# Patient Record
Sex: Female | Born: 1935 | Race: White | Hispanic: No | Marital: Single
Health system: Southern US, Community
[De-identification: ages and names within clinical notes are randomized; demographics above are authoritative.]

---

## 1997-12-05 ENCOUNTER — Ambulatory Visit (HOSPITAL_COMMUNITY): Admission: RE | Admit: 1997-12-05 | Discharge: 1997-12-05 | Payer: Self-pay | Admitting: Obstetrics & Gynecology

## 2003-12-31 ENCOUNTER — Other Ambulatory Visit: Payer: Self-pay

## 2004-01-01 ENCOUNTER — Other Ambulatory Visit: Payer: Self-pay

## 2004-09-21 ENCOUNTER — Other Ambulatory Visit: Payer: Self-pay

## 2004-09-21 ENCOUNTER — Inpatient Hospital Stay: Payer: Self-pay | Admitting: Internal Medicine

## 2004-12-28 ENCOUNTER — Ambulatory Visit: Payer: Self-pay | Admitting: Internal Medicine

## 2005-03-10 ENCOUNTER — Emergency Department: Payer: Self-pay | Admitting: Emergency Medicine

## 2005-03-10 ENCOUNTER — Other Ambulatory Visit: Payer: Self-pay

## 2011-09-18 ENCOUNTER — Inpatient Hospital Stay: Payer: Self-pay | Admitting: Family Medicine

## 2011-09-18 LAB — CBC WITH DIFFERENTIAL/PLATELET
Basophil #: 0 10*3/uL (ref 0.0–0.1)
Eosinophil #: 0.1 10*3/uL (ref 0.0–0.7)
Lymphocyte #: 0.6 10*3/uL — ABNORMAL LOW (ref 1.0–3.6)
Lymphocyte %: 14.6 %
MCV: 94 fL (ref 80–100)
Monocyte %: 12.4 %
Neutrophil #: 2.8 10*3/uL (ref 1.4–6.5)
Neutrophil %: 71 %
Platelet: 108 10*3/uL — ABNORMAL LOW (ref 150–440)
RBC: 3.98 10*6/uL (ref 3.80–5.20)
RDW: 14.1 % (ref 11.5–14.5)
WBC: 3.9 10*3/uL (ref 3.6–11.0)

## 2011-09-18 LAB — URINALYSIS, COMPLETE
Blood: NEGATIVE
Nitrite: NEGATIVE
RBC,UR: 3 /HPF (ref 0–5)
Specific Gravity: 1.006 (ref 1.003–1.030)
Squamous Epithelial: <1
WBC UR: 3 /HPF (ref 0–5)

## 2011-09-18 LAB — COMPREHENSIVE METABOLIC PANEL
Alkaline Phosphatase: 78 U/L (ref 50–136)
Anion Gap: 10 (ref 7–16)
BUN: 19 mg/dL — ABNORMAL HIGH (ref 7–18)
Bilirubin,Total: 0.7 mg/dL (ref 0.2–1.0)
Calcium, Total: 8.9 mg/dL (ref 8.5–10.1)
Chloride: 97 mmol/L — ABNORMAL LOW (ref 98–107)
Co2: 30 mmol/L (ref 21–32)
Creatinine: 0.86 mg/dL (ref 0.60–1.30)
EGFR (African American): >60
EGFR (Non-African Amer.): >60
Osmolality: 278 (ref 275–301)
Potassium: 3.7 mmol/L (ref 3.5–5.1)
Sodium: 137 mmol/L (ref 136–145)
Total Protein: 7.5 g/dL (ref 6.4–8.2)

## 2011-09-18 LAB — DIGOXIN LEVEL: Digoxin: 2.33 ng/mL

## 2011-09-18 LAB — PROTIME-INR
INR: 1.6
Prothrombin Time: 19.8 secs — ABNORMAL HIGH (ref 11.5–14.7)

## 2011-09-19 LAB — CBC WITH DIFFERENTIAL/PLATELET
Basophil #: 0 10*3/uL (ref 0.0–0.1)
Basophil %: 0.6 %
Lymphocyte #: 0.5 10*3/uL — ABNORMAL LOW (ref 1.0–3.6)
MCH: 31.5 pg (ref 26.0–34.0)
MCV: 94 fL (ref 80–100)
Monocyte #: 0.4 10*3/uL (ref 0.0–0.7)
Platelet: 93 10*3/uL — ABNORMAL LOW (ref 150–440)
RDW: 13.8 % (ref 11.5–14.5)

## 2011-09-19 LAB — HEMOGLOBIN A1C: Hemoglobin A1C: 6.9 % — ABNORMAL HIGH (ref 4.2–6.3)

## 2011-09-19 LAB — BASIC METABOLIC PANEL
BUN: 16 mg/dL (ref 7–18)
Chloride: 101 mmol/L (ref 98–107)
EGFR (African American): >60
EGFR (Non-African Amer.): >60
Glucose: 137 mg/dL — ABNORMAL HIGH (ref 65–99)
Osmolality: 285 (ref 275–301)
Potassium: 3.4 mmol/L — ABNORMAL LOW (ref 3.5–5.1)
Sodium: 141 mmol/L (ref 136–145)

## 2011-09-19 LAB — LIPID PANEL
HDL Cholesterol: 20 mg/dL — ABNORMAL LOW (ref 40–60)
Ldl Cholesterol, Calc: 57 mg/dL (ref 0–100)
Triglycerides: 127 mg/dL (ref 0–200)
VLDL Cholesterol, Calc: 25 mg/dL (ref 5–40)

## 2011-09-19 LAB — CK-MB: CK-MB: 1 ng/mL (ref 0.5–3.6)

## 2011-09-19 LAB — TROPONIN I: Troponin-I: 0.11 ng/mL — ABNORMAL HIGH

## 2011-09-20 LAB — BASIC METABOLIC PANEL
Anion Gap: 10 (ref 7–16)
BUN: 23 mg/dL — ABNORMAL HIGH (ref 7–18)
Calcium, Total: 8 mg/dL — ABNORMAL LOW (ref 8.5–10.1)
Co2: 31 mmol/L (ref 21–32)
Creatinine: 1.36 mg/dL — ABNORMAL HIGH (ref 0.60–1.30)
EGFR (African American): 47 — ABNORMAL LOW
Sodium: 140 mmol/L (ref 136–145)

## 2011-09-20 LAB — URINE CULTURE

## 2011-09-21 LAB — BASIC METABOLIC PANEL
BUN: 31 mg/dL — ABNORMAL HIGH (ref 7–18)
Calcium, Total: 8.4 mg/dL — ABNORMAL LOW (ref 8.5–10.1)
Chloride: 101 mmol/L (ref 98–107)
Osmolality: 290 (ref 275–301)
Potassium: 3.5 mmol/L (ref 3.5–5.1)
Sodium: 141 mmol/L (ref 136–145)

## 2011-09-21 LAB — CBC WITH DIFFERENTIAL/PLATELET
Basophil #: 0 10*3/uL (ref 0.0–0.1)
Eosinophil #: 0 10*3/uL (ref 0.0–0.7)
Lymphocyte #: 0.9 10*3/uL — ABNORMAL LOW (ref 1.0–3.6)
MCH: 31 pg (ref 26.0–34.0)
MCV: 96 fL (ref 80–100)
Monocyte #: 0.7 10*3/uL (ref 0.0–0.7)
Monocyte %: 6.4 %
Neutrophil #: 9.5 10*3/uL — ABNORMAL HIGH (ref 1.4–6.5)
Platelet: 98 10*3/uL — ABNORMAL LOW (ref 150–440)
RDW: 14.2 % (ref 11.5–14.5)
WBC: 11.2 10*3/uL — ABNORMAL HIGH (ref 3.6–11.0)

## 2011-09-21 LAB — PROTIME-INR: Prothrombin Time: 23.4 secs — ABNORMAL HIGH (ref 11.5–14.7)

## 2011-09-22 LAB — CBC WITH DIFFERENTIAL/PLATELET
Basophil %: 0.7 %
Eosinophil #: 0 10*3/uL (ref 0.0–0.7)
Eosinophil %: 0.1 %
HGB: 11.4 g/dL — ABNORMAL LOW (ref 12.0–16.0)
Lymphocyte #: 1.1 10*3/uL (ref 1.0–3.6)
MCH: 31.1 pg (ref 26.0–34.0)
MCHC: 33 g/dL (ref 32.0–36.0)
MCV: 95 fL (ref 80–100)
Monocyte #: 0.6 10*3/uL (ref 0.0–0.7)
Neutrophil #: 9 10*3/uL — ABNORMAL HIGH (ref 1.4–6.5)
RBC: 3.65 10*6/uL — ABNORMAL LOW (ref 3.80–5.20)

## 2011-09-22 LAB — BASIC METABOLIC PANEL
BUN: 40 mg/dL — ABNORMAL HIGH (ref 7–18)
Calcium, Total: 8.2 mg/dL — ABNORMAL LOW (ref 8.5–10.1)
Creatinine: 1.12 mg/dL (ref 0.60–1.30)
Glucose: 129 mg/dL — ABNORMAL HIGH (ref 65–99)
Sodium: 137 mmol/L (ref 136–145)

## 2011-09-22 LAB — PROTIME-INR
INR: 2.4
Prothrombin Time: 26.3 secs — ABNORMAL HIGH (ref 11.5–14.7)

## 2011-09-23 LAB — CBC WITH DIFFERENTIAL/PLATELET
Basophil #: 0 x10 3/mm 3
Basophil %: 0.4 %
Eosinophil #: 0.2 x10 3/mm 3
Eosinophil %: 1.9 %
HCT: 35 %
HGB: 11.2 g/dL — ABNORMAL LOW
Lymphocyte %: 11.4 %
Lymphs Abs: 1.1 x10 3/mm 3
MCH: 30.6 pg
MCHC: 32.1 g/dL
MCV: 96 fL
Monocyte #: 0.7 x10 3/mm 3
Monocyte %: 7.3 %
Neutrophil #: 7.3 x10 3/mm 3 — ABNORMAL HIGH
Neutrophil %: 79 %
Platelet: 161 x10 3/mm 3
RBC: 3.66 X10 6/mm 3 — ABNORMAL LOW
RDW: 14.3 %
WBC: 9.3 x10 3/mm 3

## 2011-09-23 LAB — BASIC METABOLIC PANEL
Anion Gap: 9 (ref 7–16)
BUN: 49 mg/dL — ABNORMAL HIGH (ref 7–18)
Chloride: 98 mmol/L (ref 98–107)
Co2: 30 mmol/L (ref 21–32)
Creatinine: 1.04 mg/dL (ref 0.60–1.30)
Potassium: 4.3 mmol/L (ref 3.5–5.1)
Sodium: 137 mmol/L (ref 136–145)

## 2011-09-23 LAB — PROTIME-INR
INR: 2.7
Prothrombin Time: 28.9 s — ABNORMAL HIGH

## 2011-09-24 LAB — BASIC METABOLIC PANEL
Anion Gap: 7 (ref 7–16)
Calcium, Total: 9.2 mg/dL (ref 8.5–10.1)
Co2: 31 mmol/L (ref 21–32)
Creatinine: 0.93 mg/dL (ref 0.60–1.30)
EGFR (African American): >60
EGFR (Non-African Amer.): >60
Glucose: 138 mg/dL — ABNORMAL HIGH (ref 65–99)
Sodium: 140 mmol/L (ref 136–145)

## 2011-09-24 LAB — CULTURE, BLOOD (SINGLE)

## 2011-09-25 LAB — CBC WITH DIFFERENTIAL/PLATELET
Basophil #: 0.1 10*3/uL (ref 0.0–0.1)
Eosinophil #: 0.3 10*3/uL (ref 0.0–0.7)
Eosinophil %: 3.3 %
HCT: 38.5 % (ref 35.0–47.0)
Lymphocyte %: 16.2 %
MCHC: 32.5 g/dL (ref 32.0–36.0)
Monocyte %: 9.8 %
Neutrophil #: 6.4 10*3/uL (ref 1.4–6.5)
Neutrophil %: 69.8 %
RBC: 4.06 10*6/uL (ref 3.80–5.20)
RDW: 14.6 % — ABNORMAL HIGH (ref 11.5–14.5)
WBC: 9.2 10*3/uL (ref 3.6–11.0)

## 2011-09-25 LAB — BASIC METABOLIC PANEL
Calcium, Total: 9.5 mg/dL (ref 8.5–10.1)
Co2: 30 mmol/L (ref 21–32)
Creatinine: 0.86 mg/dL (ref 0.60–1.30)
EGFR (Non-African Amer.): >60
Glucose: 152 mg/dL — ABNORMAL HIGH (ref 65–99)
Potassium: 5 mmol/L (ref 3.5–5.1)
Sodium: 140 mmol/L (ref 136–145)

## 2011-09-26 LAB — PROTIME-INR: Prothrombin Time: 43.5 secs — ABNORMAL HIGH (ref 11.5–14.7)

## 2011-09-27 LAB — BASIC METABOLIC PANEL
Anion Gap: 7 (ref 7–16)
Calcium, Total: 9.2 mg/dL (ref 8.5–10.1)
Chloride: 102 mmol/L (ref 98–107)
Co2: 33 mmol/L — ABNORMAL HIGH (ref 21–32)
Creatinine: 0.69 mg/dL (ref 0.60–1.30)
EGFR (African American): >60
Osmolality: 291 (ref 275–301)
Sodium: 142 mmol/L (ref 136–145)

## 2011-09-27 LAB — CBC WITH DIFFERENTIAL/PLATELET
Basophil #: 0 10*3/uL (ref 0.0–0.1)
Basophil %: 0.5 %
Eosinophil #: 0.3 10*3/uL (ref 0.0–0.7)
Eosinophil %: 3.3 %
Lymphocyte #: 1.4 10*3/uL (ref 1.0–3.6)
Lymphocyte %: 18.2 %
MCV: 95 fL (ref 80–100)
Monocyte %: 9.3 %
Neutrophil #: 5.2 10*3/uL (ref 1.4–6.5)
Neutrophil %: 68.7 %
Platelet: 316 10*3/uL (ref 150–440)
RDW: 14.6 % — ABNORMAL HIGH (ref 11.5–14.5)
WBC: 7.6 10*3/uL (ref 3.6–11.0)

## 2011-10-01 LAB — CBC WITH DIFFERENTIAL/PLATELET
Basophil %: 0.7 %
Eosinophil #: 0.2 10*3/uL (ref 0.0–0.7)
Eosinophil %: 2.6 %
HCT: 33.7 % — ABNORMAL LOW (ref 35.0–47.0)
HGB: 11 g/dL — ABNORMAL LOW (ref 12.0–16.0)
Lymphocyte #: 1 10*3/uL (ref 1.0–3.6)
MCH: 30.9 pg (ref 26.0–34.0)
MCV: 95 fL (ref 80–100)
Monocyte #: 0.6 10*3/uL (ref 0.0–0.7)
Monocyte %: 9.2 %
Neutrophil %: 71.3 %
RBC: 3.55 10*6/uL — ABNORMAL LOW (ref 3.80–5.20)

## 2011-10-01 LAB — BASIC METABOLIC PANEL
Anion Gap: 7 (ref 7–16)
BUN: 23 mg/dL — ABNORMAL HIGH (ref 7–18)
Calcium, Total: 9.2 mg/dL (ref 8.5–10.1)
Chloride: 100 mmol/L (ref 98–107)
Co2: 35 mmol/L — ABNORMAL HIGH (ref 21–32)
Creatinine: 0.79 mg/dL (ref 0.60–1.30)
EGFR (African American): >60
EGFR (Non-African Amer.): >60
Glucose: 106 mg/dL — ABNORMAL HIGH (ref 65–99)
Osmolality: 287 (ref 275–301)

## 2011-10-01 LAB — PROTIME-INR
INR: 2.9
Prothrombin Time: 30.7 secs — ABNORMAL HIGH (ref 11.5–14.7)

## 2011-10-02 ENCOUNTER — Inpatient Hospital Stay: Payer: Self-pay | Admitting: Internal Medicine

## 2011-10-02 LAB — COMPREHENSIVE METABOLIC PANEL
Albumin: 3.2 g/dL — ABNORMAL LOW (ref 3.4–5.0)
Anion Gap: 7 (ref 7–16)
BUN: 26 mg/dL — ABNORMAL HIGH (ref 7–18)
Bilirubin,Total: 0.7 mg/dL (ref 0.2–1.0)
Chloride: 101 mmol/L (ref 98–107)
Co2: 33 mmol/L — ABNORMAL HIGH (ref 21–32)
Creatinine: 0.86 mg/dL (ref 0.60–1.30)
EGFR (African American): >60
Osmolality: 288 (ref 275–301)
Potassium: 4.5 mmol/L (ref 3.5–5.1)
SGOT(AST): 40 U/L — ABNORMAL HIGH (ref 15–37)
Total Protein: 7.6 g/dL (ref 6.4–8.2)

## 2011-10-02 LAB — URINALYSIS, COMPLETE
Bacteria: NONE SEEN
Bilirubin,UR: NEGATIVE
Blood: NEGATIVE
Glucose,UR: NEGATIVE mg/dL (ref 0–75)
Ketone: NEGATIVE
Protein: NEGATIVE
RBC,UR: 2 /HPF (ref 0–5)
Specific Gravity: 1.005 (ref 1.003–1.030)
Squamous Epithelial: 1

## 2011-10-02 LAB — CBC WITH DIFFERENTIAL/PLATELET
Basophil #: 0.1 10*3/uL (ref 0.0–0.1)
Basophil %: 0.9 %
Eosinophil #: 0.1 10*3/uL (ref 0.0–0.7)
Eosinophil %: 1.5 %
HCT: 34.3 % — ABNORMAL LOW (ref 35.0–47.0)
HGB: 11.3 g/dL — ABNORMAL LOW (ref 12.0–16.0)
Lymphocyte #: 1.1 10*3/uL (ref 1.0–3.6)
Lymphocyte %: 15.3 %
MCHC: 32.9 g/dL (ref 32.0–36.0)
Monocyte #: 0.7 10*3/uL (ref 0.0–0.7)
Monocyte %: 9.7 %
Neutrophil #: 5.2 10*3/uL (ref 1.4–6.5)
Neutrophil %: 72.6 %
RBC: 3.63 10*6/uL — ABNORMAL LOW (ref 3.80–5.20)

## 2011-10-02 LAB — PROTIME-INR: Prothrombin Time: 24.5 secs — ABNORMAL HIGH (ref 11.5–14.7)

## 2011-10-02 LAB — CK-MB: CK-MB: 1.4 ng/mL (ref 0.5–3.6)

## 2011-10-02 LAB — PRO B NATRIURETIC PEPTIDE: B-Type Natriuretic Peptide: 2633 pg/mL — ABNORMAL HIGH (ref 0–450)

## 2011-10-03 LAB — BASIC METABOLIC PANEL
Calcium, Total: 8.8 mg/dL (ref 8.5–10.1)
Chloride: 102 mmol/L (ref 98–107)
Co2: 34 mmol/L — ABNORMAL HIGH (ref 21–32)
Potassium: 3.8 mmol/L (ref 3.5–5.1)
Sodium: 144 mmol/L (ref 136–145)

## 2011-10-03 LAB — CBC WITH DIFFERENTIAL/PLATELET
Basophil #: 0.1 10*3/uL (ref 0.0–0.1)
Basophil %: 0.9 %
Eosinophil #: 0.1 10*3/uL (ref 0.0–0.7)
Eosinophil %: 2 %
HCT: 30 % — ABNORMAL LOW (ref 35.0–47.0)
HGB: 9.8 g/dL — ABNORMAL LOW (ref 12.0–16.0)
Lymphocyte %: 20.6 %
MCH: 30.7 pg (ref 26.0–34.0)
MCV: 94 fL (ref 80–100)
Monocyte #: 0.7 10*3/uL (ref 0.0–0.7)
RBC: 3.19 10*6/uL — ABNORMAL LOW (ref 3.80–5.20)
WBC: 6.1 10*3/uL (ref 3.6–11.0)

## 2011-10-03 LAB — PROTIME-INR: INR: 2.6

## 2011-10-03 LAB — HEPATIC FUNCTION PANEL A (ARMC)
Albumin: 3 g/dL — ABNORMAL LOW (ref 3.4–5.0)
Alkaline Phosphatase: 86 U/L (ref 50–136)
Bilirubin, Direct: 0.2 mg/dL (ref 0.00–0.20)
SGPT (ALT): 28 U/L

## 2011-10-03 LAB — TROPONIN I: Troponin-I: 0.07 ng/mL — ABNORMAL HIGH

## 2011-10-03 LAB — CK-MB: CK-MB: 1.1 ng/mL (ref 0.5–3.6)

## 2011-10-04 LAB — BASIC METABOLIC PANEL
BUN: 30 mg/dL — ABNORMAL HIGH (ref 7–18)
Calcium, Total: 8.9 mg/dL (ref 8.5–10.1)
Co2: 35 mmol/L — ABNORMAL HIGH (ref 21–32)
EGFR (African American): >60
EGFR (Non-African Amer.): >60
Glucose: 103 mg/dL — ABNORMAL HIGH (ref 65–99)
Osmolality: 284 (ref 275–301)

## 2011-10-04 LAB — PROTIME-INR: INR: 2

## 2011-10-05 LAB — BASIC METABOLIC PANEL
Calcium, Total: 8.7 mg/dL (ref 8.5–10.1)
Chloride: 100 mmol/L (ref 98–107)
Co2: 34 mmol/L — ABNORMAL HIGH (ref 21–32)
EGFR (Non-African Amer.): 59 — ABNORMAL LOW
Glucose: 101 mg/dL — ABNORMAL HIGH (ref 65–99)
Osmolality: 289 (ref 275–301)
Potassium: 3.9 mmol/L (ref 3.5–5.1)

## 2011-10-05 LAB — HEMOGLOBIN: HGB: 9.5 g/dL — ABNORMAL LOW (ref 12.0–16.0)

## 2011-10-06 ENCOUNTER — Encounter: Payer: Self-pay | Admitting: Internal Medicine

## 2011-10-10 LAB — PROTIME-INR: INR: 1.4

## 2011-10-10 LAB — BASIC METABOLIC PANEL
Anion Gap: 7 (ref 7–16)
BUN: 32 mg/dL — ABNORMAL HIGH (ref 7–18)
Calcium, Total: 8.8 mg/dL (ref 8.5–10.1)
Chloride: 100 mmol/L (ref 98–107)
Co2: 34 mmol/L — ABNORMAL HIGH (ref 21–32)
Creatinine: 1.02 mg/dL (ref 0.60–1.30)
EGFR (Non-African Amer.): 54 — ABNORMAL LOW
Glucose: 90 mg/dL (ref 65–99)

## 2011-10-10 LAB — CBC WITH DIFFERENTIAL/PLATELET
Basophil #: 0 10*3/uL (ref 0.0–0.1)
Lymphocyte #: 1 10*3/uL (ref 1.0–3.6)
Lymphocyte %: 28.8 %
MCV: 94 fL (ref 80–100)
Monocyte #: 0.4 10*3/uL (ref 0.0–0.7)
Monocyte %: 12.2 %
Neutrophil %: 53.3 %
Platelet: 147 10*3/uL — ABNORMAL LOW (ref 150–440)
RDW: 15.5 % — ABNORMAL HIGH (ref 11.5–14.5)
WBC: 3.5 10*3/uL — ABNORMAL LOW (ref 3.6–11.0)

## 2011-10-17 LAB — PROTIME-INR
INR: 1.3
Prothrombin Time: 16.6 secs — ABNORMAL HIGH (ref 11.5–14.7)

## 2011-10-18 ENCOUNTER — Ambulatory Visit: Payer: Self-pay | Admitting: Internal Medicine

## 2011-10-18 ENCOUNTER — Encounter: Payer: Self-pay | Admitting: Internal Medicine

## 2011-10-18 LAB — PROTIME-INR
INR: 1.4
Prothrombin Time: 17.3 secs — ABNORMAL HIGH (ref 11.5–14.7)

## 2011-10-22 LAB — PROTIME-INR
INR: 1.6
Prothrombin Time: 19.6 secs — ABNORMAL HIGH (ref 11.5–14.7)

## 2011-10-27 ENCOUNTER — Inpatient Hospital Stay: Payer: Self-pay

## 2011-10-27 LAB — COMPREHENSIVE METABOLIC PANEL
Albumin: 3.3 g/dL — ABNORMAL LOW (ref 3.4–5.0)
Anion Gap: 11 (ref 7–16)
BUN: 26 mg/dL — ABNORMAL HIGH (ref 7–18)
Bilirubin,Total: 0.7 mg/dL (ref 0.2–1.0)
Chloride: 99 mmol/L (ref 98–107)
Creatinine: 0.97 mg/dL (ref 0.60–1.30)
EGFR (African American): >60
Glucose: 164 mg/dL — ABNORMAL HIGH (ref 65–99)
Osmolality: 286 (ref 275–301)
Potassium: 4.4 mmol/L (ref 3.5–5.1)
SGOT(AST): 40 U/L — ABNORMAL HIGH (ref 15–37)
Sodium: 139 mmol/L (ref 136–145)
Total Protein: 7.1 g/dL (ref 6.4–8.2)

## 2011-10-27 LAB — CBC WITH DIFFERENTIAL/PLATELET
Basophil #: 0 10*3/uL (ref 0.0–0.1)
Eosinophil #: 0.1 10*3/uL (ref 0.0–0.7)
Eosinophil %: 0.9 %
HCT: 29 % — ABNORMAL LOW (ref 35.0–47.0)
HGB: 9.5 g/dL — ABNORMAL LOW (ref 12.0–16.0)
Lymphocyte #: 1.2 10*3/uL (ref 1.0–3.6)
Lymphocyte %: 17.4 %
MCHC: 32.9 g/dL (ref 32.0–36.0)
MCV: 94 fL (ref 80–100)
Monocyte %: 5.2 %
Neutrophil #: 5.1 10*3/uL (ref 1.4–6.5)
Platelet: 153 10*3/uL (ref 150–440)
RBC: 3.07 10*6/uL — ABNORMAL LOW (ref 3.80–5.20)
RDW: 14.9 % — ABNORMAL HIGH (ref 11.5–14.5)
WBC: 6.8 10*3/uL (ref 3.6–11.0)

## 2011-10-27 LAB — TROPONIN I: Troponin-I: 0.05 ng/mL

## 2011-10-28 LAB — BASIC METABOLIC PANEL
Anion Gap: 10 (ref 7–16)
BUN: 36 mg/dL — ABNORMAL HIGH (ref 7–18)
Calcium, Total: 9.5 mg/dL (ref 8.5–10.1)
Co2: 33 mmol/L — ABNORMAL HIGH (ref 21–32)
Creatinine: 0.92 mg/dL (ref 0.60–1.30)
EGFR (African American): >60
EGFR (Non-African Amer.): >60
Glucose: 132 mg/dL — ABNORMAL HIGH (ref 65–99)
Sodium: 142 mmol/L (ref 136–145)

## 2011-10-28 LAB — PROTIME-INR
INR: 2
Prothrombin Time: 22.8 secs — ABNORMAL HIGH (ref 11.5–14.7)

## 2011-10-28 LAB — CBC WITH DIFFERENTIAL/PLATELET
Basophil #: 0 10*3/uL (ref 0.0–0.1)
HCT: 31.8 % — ABNORMAL LOW (ref 35.0–47.0)
Lymphocyte #: 1.1 10*3/uL (ref 1.0–3.6)
Lymphocyte %: 15.5 %
MCH: 30.9 pg (ref 26.0–34.0)
Monocyte %: 8.9 %
Neutrophil %: 75.6 %
Platelet: 154 10*3/uL (ref 150–440)
RBC: 3.37 10*6/uL — ABNORMAL LOW (ref 3.80–5.20)
RDW: 15.2 % — ABNORMAL HIGH (ref 11.5–14.5)
WBC: 6.9 10*3/uL (ref 3.6–11.0)

## 2011-10-29 LAB — BASIC METABOLIC PANEL
BUN: 39 mg/dL — ABNORMAL HIGH (ref 7–18)
Calcium, Total: 9.3 mg/dL (ref 8.5–10.1)
Co2: 37 mmol/L — ABNORMAL HIGH (ref 21–32)
EGFR (Non-African Amer.): >60
Glucose: 109 mg/dL — ABNORMAL HIGH (ref 65–99)
Potassium: 3.5 mmol/L (ref 3.5–5.1)
Sodium: 137 mmol/L (ref 136–145)

## 2011-10-29 LAB — CBC WITH DIFFERENTIAL/PLATELET
Basophil #: 0 10*3/uL (ref 0.0–0.1)
Basophil %: 0.2 %
Eosinophil #: 0 10*3/uL (ref 0.0–0.7)
HCT: 31.4 % — ABNORMAL LOW (ref 35.0–47.0)
Lymphocyte %: 9.7 %
MCHC: 32.2 g/dL (ref 32.0–36.0)
Monocyte %: 6.4 %
Neutrophil #: 8.7 10*3/uL — ABNORMAL HIGH (ref 1.4–6.5)
Neutrophil %: 83.7 %
Platelet: 189 10*3/uL (ref 150–440)
RDW: 15.3 % — ABNORMAL HIGH (ref 11.5–14.5)
WBC: 10.4 10*3/uL (ref 3.6–11.0)

## 2011-10-29 LAB — PROTIME-INR: INR: 2.8

## 2011-10-30 LAB — CBC WITH DIFFERENTIAL/PLATELET
Basophil #: 0 10*3/uL (ref 0.0–0.1)
Eosinophil #: 0 10*3/uL (ref 0.0–0.7)
Eosinophil %: 0 %
HGB: 9.8 g/dL — ABNORMAL LOW (ref 12.0–16.0)
Lymphocyte #: 1 10*3/uL (ref 1.0–3.6)
Lymphocyte %: 13.8 %
MCH: 30.8 pg (ref 26.0–34.0)
MCHC: 32.5 g/dL (ref 32.0–36.0)
Monocyte #: 0.8 10*3/uL — ABNORMAL HIGH (ref 0.0–0.7)
Neutrophil %: 75.2 %
Platelet: 188 10*3/uL (ref 150–440)
RBC: 3.19 10*6/uL — ABNORMAL LOW (ref 3.80–5.20)
RDW: 15.4 % — ABNORMAL HIGH (ref 11.5–14.5)

## 2011-10-30 LAB — BASIC METABOLIC PANEL
Anion Gap: 9 (ref 7–16)
BUN: 42 mg/dL — ABNORMAL HIGH (ref 7–18)
Calcium, Total: 9.4 mg/dL (ref 8.5–10.1)
Chloride: 96 mmol/L — ABNORMAL LOW (ref 98–107)
Creatinine: 0.75 mg/dL (ref 0.60–1.30)
EGFR (Non-African Amer.): >60
Osmolality: 296 (ref 275–301)

## 2011-10-30 LAB — PROTIME-INR
INR: 3.6
Prothrombin Time: 35.6 secs — ABNORMAL HIGH (ref 11.5–14.7)

## 2011-10-31 LAB — CBC WITH DIFFERENTIAL/PLATELET
Basophil #: 0 10*3/uL (ref 0.0–0.1)
Basophil %: 0.3 %
Eosinophil %: 0 %
Lymphocyte #: 1.3 10*3/uL (ref 1.0–3.6)
Lymphocyte %: 21.3 %
MCHC: 32.5 g/dL (ref 32.0–36.0)
MCV: 94 fL (ref 80–100)
Neutrophil #: 4.2 10*3/uL (ref 1.4–6.5)
Neutrophil %: 69.4 %
Platelet: 183 10*3/uL (ref 150–440)
RBC: 3.24 10*6/uL — ABNORMAL LOW (ref 3.80–5.20)
RDW: 14.4 % (ref 11.5–14.5)
WBC: 6.1 10*3/uL (ref 3.6–11.0)

## 2011-10-31 LAB — BASIC METABOLIC PANEL
Anion Gap: 9 (ref 7–16)
Calcium, Total: 9.6 mg/dL (ref 8.5–10.1)
Chloride: 97 mmol/L — ABNORMAL LOW (ref 98–107)
Co2: 38 mmol/L — ABNORMAL HIGH (ref 21–32)
Creatinine: 0.86 mg/dL (ref 0.60–1.30)
EGFR (African American): >60
Glucose: 120 mg/dL — ABNORMAL HIGH (ref 65–99)
Osmolality: 301 (ref 275–301)
Sodium: 144 mmol/L (ref 136–145)

## 2011-10-31 LAB — PROTIME-INR
INR: 4.4
Prothrombin Time: 41.5 secs — ABNORMAL HIGH (ref 11.5–14.7)

## 2011-10-31 LAB — EXPECTORATED SPUTUM ASSESSMENT W GRAM STAIN, RFLX TO RESP C

## 2011-11-01 LAB — CBC WITH DIFFERENTIAL/PLATELET
Basophil #: 0 10*3/uL (ref 0.0–0.1)
Basophil %: 0.2 %
Eosinophil #: 0 10*3/uL (ref 0.0–0.7)
HGB: 9.7 g/dL — ABNORMAL LOW (ref 12.0–16.0)
MCH: 30.3 pg (ref 26.0–34.0)
MCHC: 32 g/dL (ref 32.0–36.0)
MCV: 95 fL (ref 80–100)
Monocyte #: 0.8 10*3/uL — ABNORMAL HIGH (ref 0.0–0.7)
Neutrophil #: 5.1 10*3/uL (ref 1.4–6.5)
Platelet: 222 10*3/uL (ref 150–440)
RBC: 3.21 10*6/uL — ABNORMAL LOW (ref 3.80–5.20)
WBC: 7.9 10*3/uL (ref 3.6–11.0)

## 2011-11-01 LAB — BASIC METABOLIC PANEL
Anion Gap: 4 — ABNORMAL LOW (ref 7–16)
Co2: 41 mmol/L (ref 21–32)
Creatinine: 0.75 mg/dL (ref 0.60–1.30)
EGFR (African American): >60

## 2011-11-01 LAB — CULTURE, BLOOD (SINGLE)

## 2011-11-01 LAB — PROTIME-INR: Prothrombin Time: 39.2 secs — ABNORMAL HIGH (ref 11.5–14.7)

## 2011-11-18 ENCOUNTER — Encounter: Payer: Self-pay | Admitting: Internal Medicine

## 2011-11-18 ENCOUNTER — Ambulatory Visit: Payer: Self-pay | Admitting: Internal Medicine

## 2011-12-18 ENCOUNTER — Encounter: Payer: Self-pay | Admitting: Internal Medicine

## 2012-02-17 DEATH — deceased

## 2014-01-29 IMAGING — CR DG CHEST 1V
1 series · 1 of 1 positions shown · non-contrast
Comparison: none

REASON FOR EXAM: lateral; fever and hypoxia
COMMENTS:

PROCEDURE:     DXR - DXR CHEST 1 VIEWAP OR PA  - September 18, 2011  [DATE]
RESULT:     Comparison: Portable chest radiograph performed earlier same day.

[w chest pa]
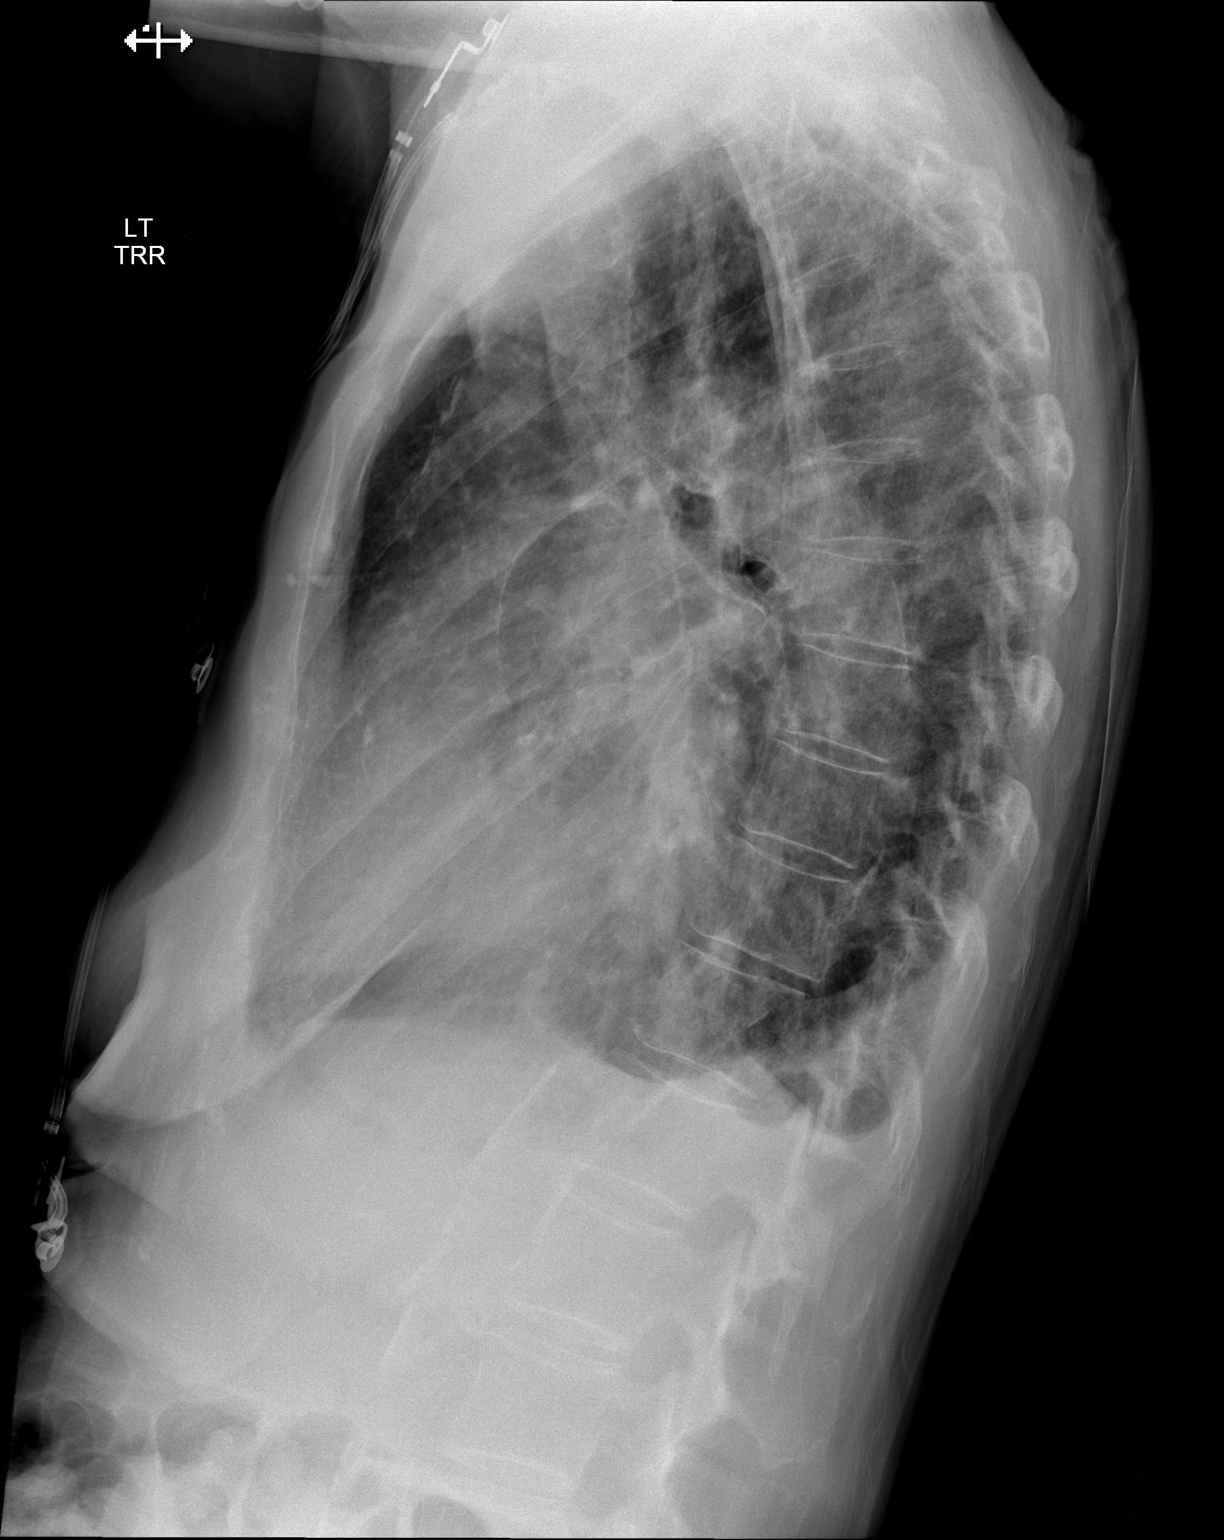

[1 of 1 positions shown; findings below may reference images not displayed]

FINDINGS: Evaluation limited by single view. There are small bilateral pleural
effusions, likely left greater than right. There is prominence of the
pulmonary interstitium.
IMPRESSION: 1. Small bilateral pleural effusions, likely left greater than right.
2. Prominent pulmonary interstitium, which may represent mild interstitial
pulmonary edema.

## 2014-02-01 IMAGING — CT CT CHEST W/ CM
1 series · 15 of 33 positions shown, 19 images · non-contrast
Comparison: None

REASON FOR EXAM: hypoxia, fever, a fib
COMMENTS:

PROCEDURE:     CT  - CT CHEST (FOR PE) W  - September 21, 2011 [DATE]
RESULT:     Indications: Hypoxia
TECHNIQUE: A thin-section spiral CT from the lung apices to the upper
abdomen was acquired on a multi slice scanner following 60 ml [id number]-[id number]
intravenous contrast. These images were then transferred to the Siemens work
station and were subsequently reviewed utilizing 3-D reconstructions and MIP
images.

[Series 4: soft tissue · axial · 0.59mm/px · z∈[-328,-52]mm · 15 of 108 slices shown, 19 images]
[im 8/108  mediastinal]
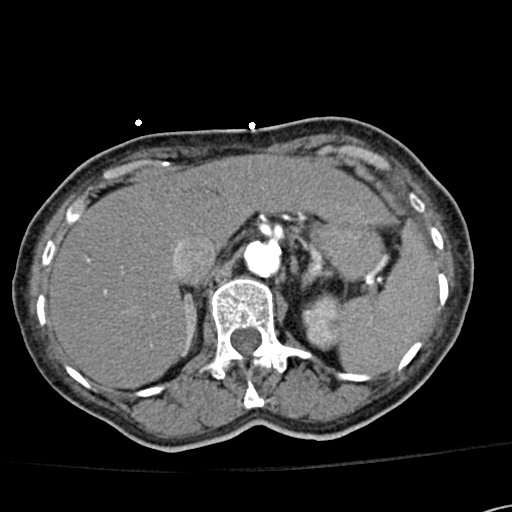
[im 8/108  lung]
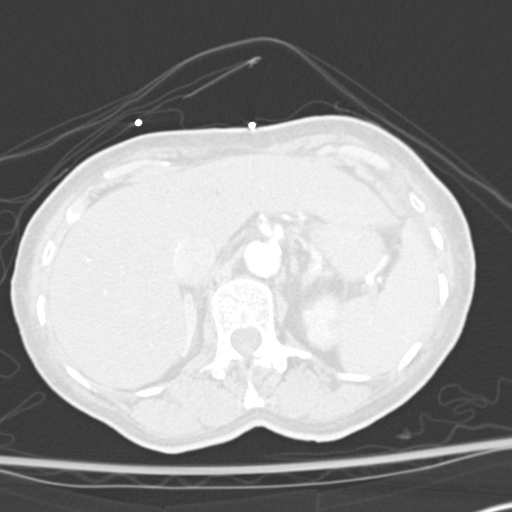
[im 16/108  lung]
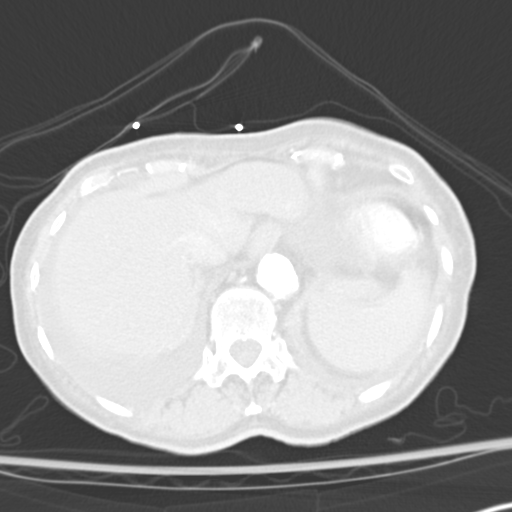
[im 22/108  lung]
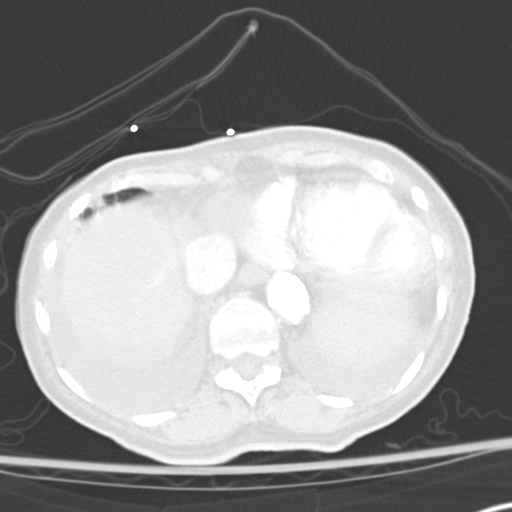
[im 28/108  lung]
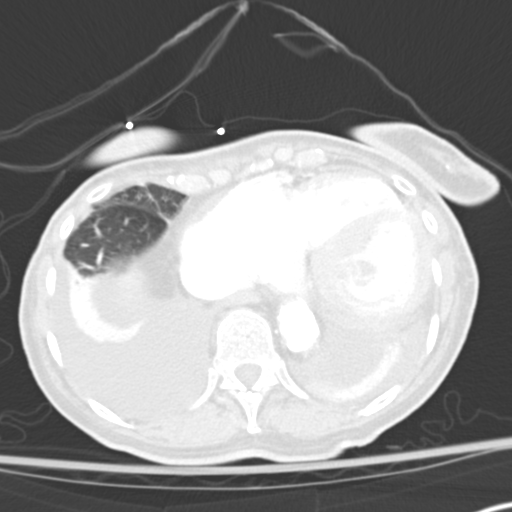
[im 36/108  mediastinal]
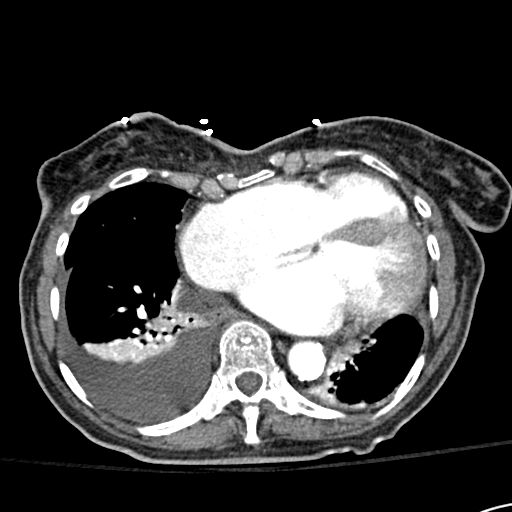
[im 36/108  lung]
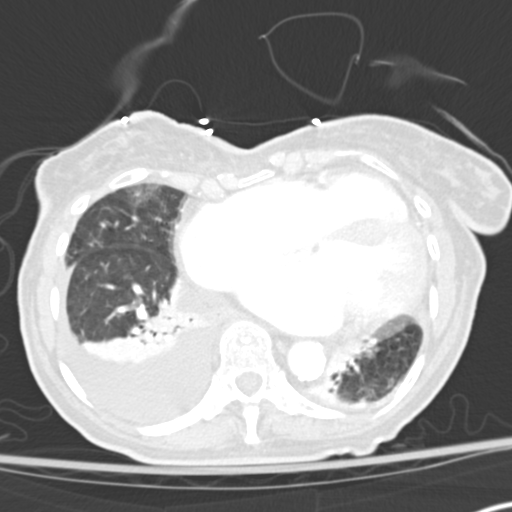
[im 43/108  lung]
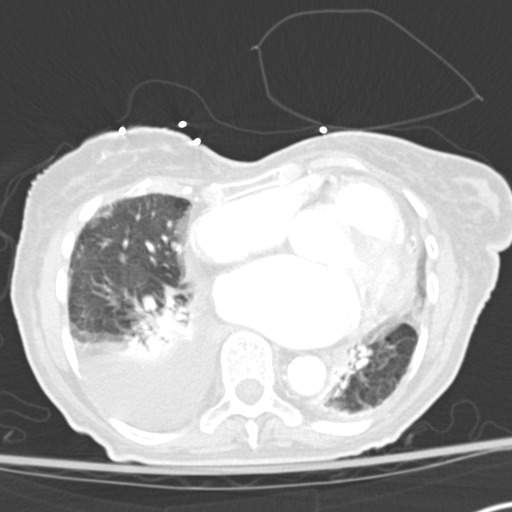
[im 48/108  lung]
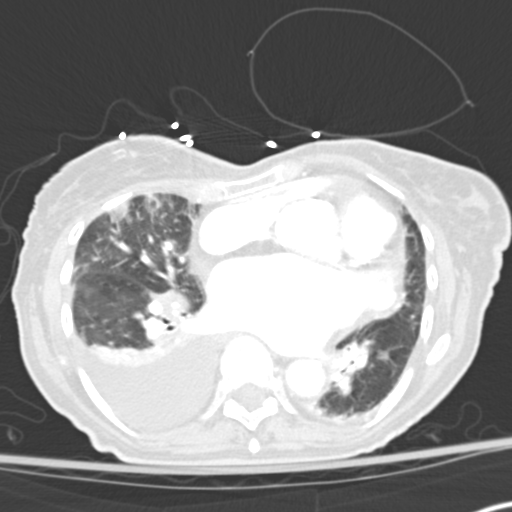
[im 56/108  lung]
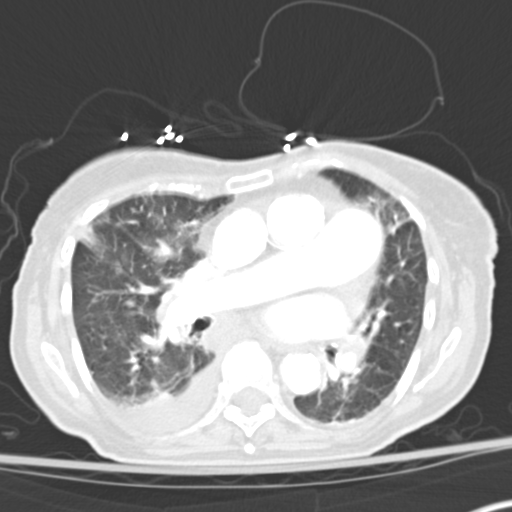
[im 60/108  mediastinal]
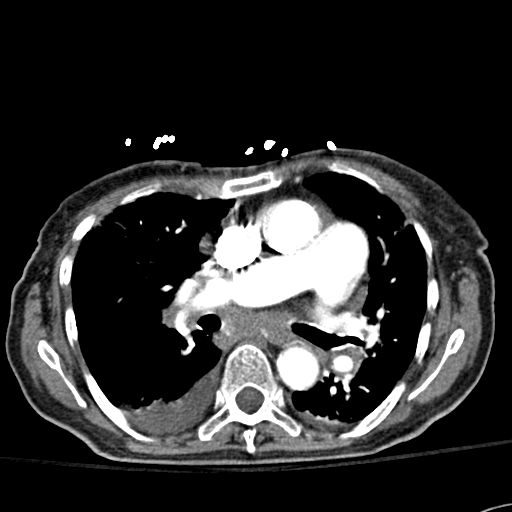
[im 60/108  lung]
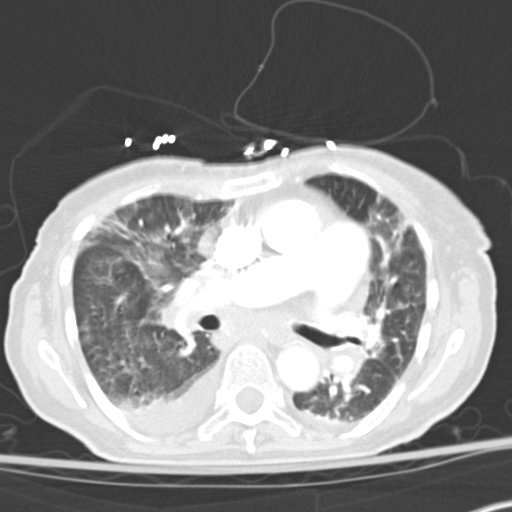
[im 65/108  lung]
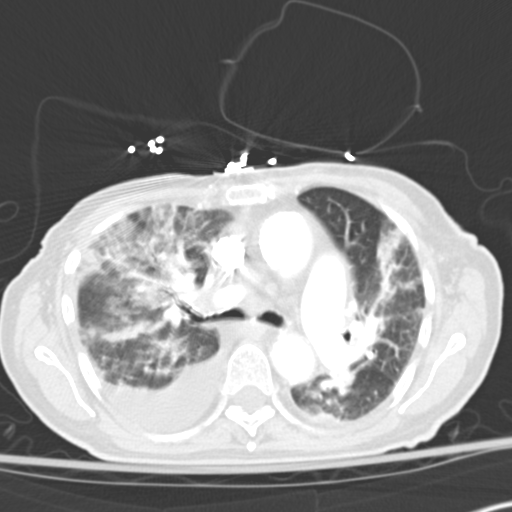
[im 72/108  lung]
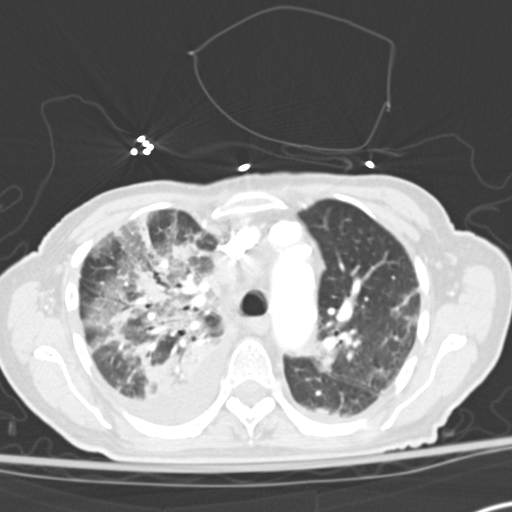
[im 80/108  lung]
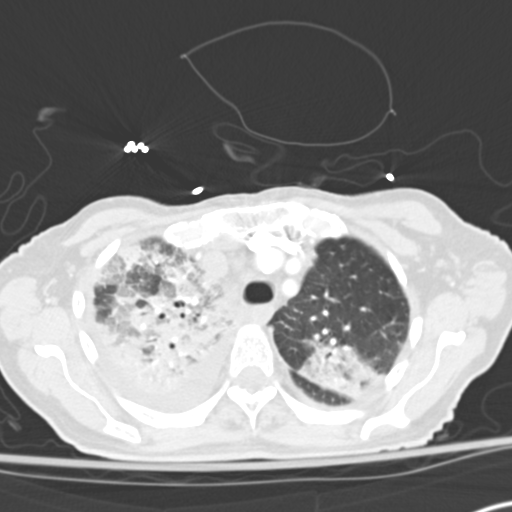
[im 86/108  mediastinal]
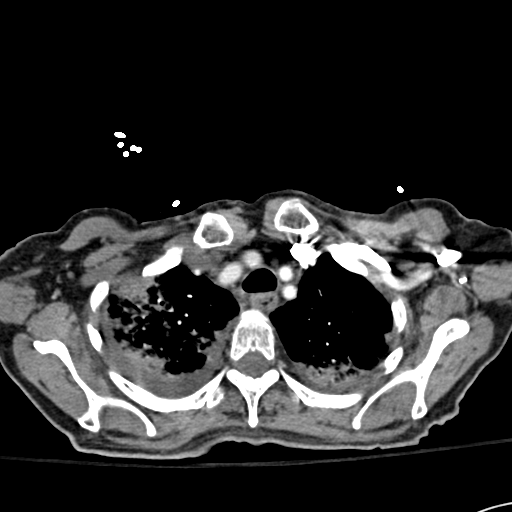
[im 86/108  lung]
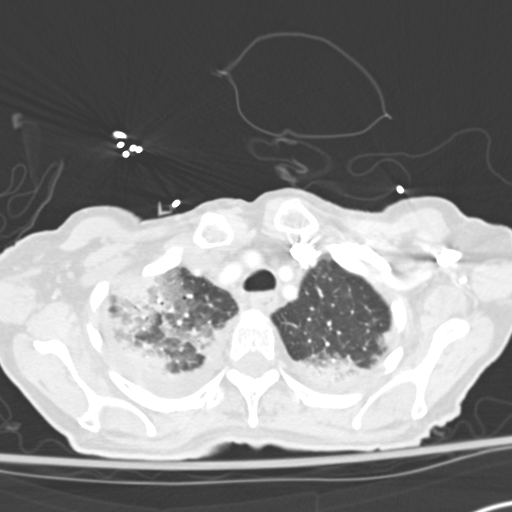
[im 92/108  lung]
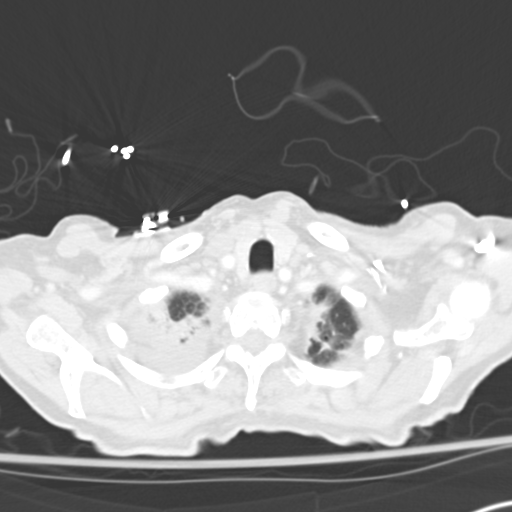
[im 100/108  lung]
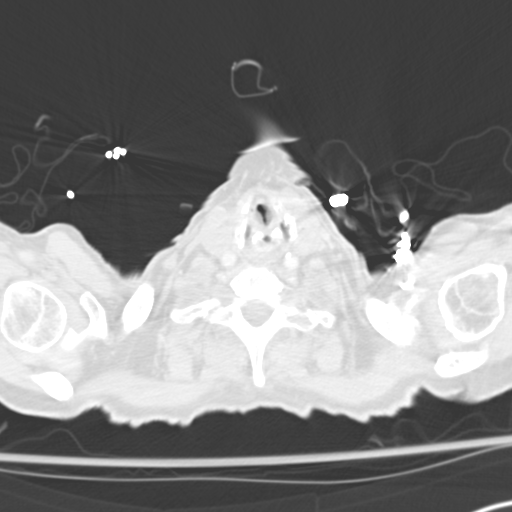

[15 of 33 positions shown; findings below may reference images not displayed]

FINDINGS: There is adequate opacification of the pulmonary arteries. There is no
pulmonary embolus. The main pulmonary artery, right main pulmonary artery,
and left main pulmonary arteries are normal in size. The heart size is
enlarged. There is no pericardial effusion. There is coronary artery
atherosclerosis in the left main, LAD and circumflex coronary arteries.

There is left upper lobe consolidation. There is a large area of
consolidation in the right upper lobe. There are patchy areas of groundglass
opacities interspersed in the areas of consolidation in the right upper
lobe. There are patchy areas of airspace disease in the right middle lobe.
There is a moderate right pleural effusion. There is a trace left pleural
effusion. There is thoracic aortic atherosclerosis.

There are multiple nonpathologically enlarged mediastinal and hilar lymph
nodes which may be reactive.

The osseous structures are unremarkable.

The visualized portions of the upper abdomen are unremarkable.
IMPRESSION: 1. No CT evidence of pulmonary embolus.

2. Right upper lobe, left upper lobe and a small area of right middle lobe
airspace disease most concerning for multilobar pneumonia. Moderate right
pleural effusion and trace left pleural effusion.

## 2014-02-12 IMAGING — CR DG CHEST 1V PORT
1 series · 1 of 1 positions shown · non-contrast
Comparison: none

REASON FOR EXAM: sob
COMMENTS:

[ap]
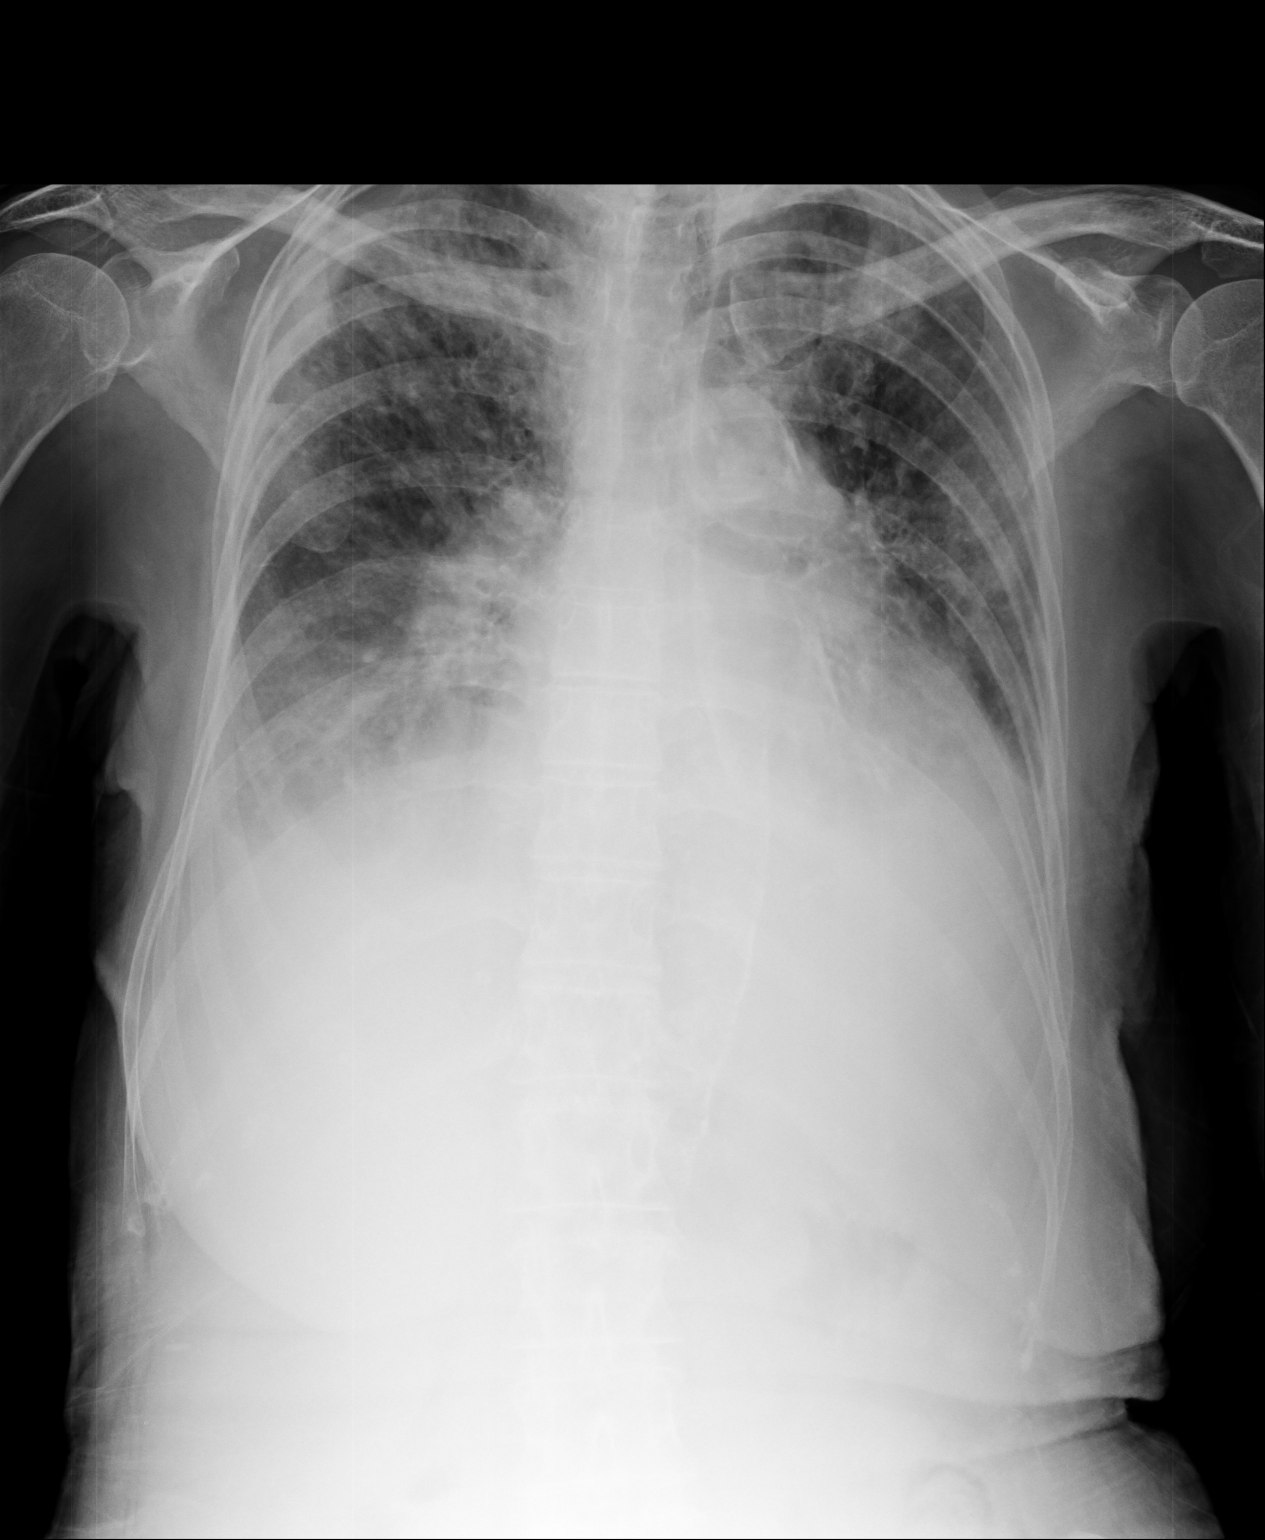

[1 of 1 positions shown; findings below may reference images not displayed]

PROCEDURE:     DXR - DXR PORTABLE CHEST SINGLE VIEW  - October 02, 2011 [DATE]

RESULT:     Comparison is made to the previous exam dated 19 September, 2011.

There is increasing density at the right lung base which could represent
atelectasis or infiltrate. Bilateral pleural effusions are present. There is
diffuse pulmonary edema with cardiomegaly and atherosclerotic calcification
present. Central pulmonary vascular prominence is present.
IMPRESSION: Findings consistent with congestive heart failure with
increasing pleural effusions and increasing right lung base atelectasis
versus pneumonia.

## 2014-12-11 NOTE — Discharge Summary (Signed)
PATIENT NAME:  Elaine Rowland, Elaine Rowland MR#:  [id number] DATE OF BIRTH:  1920-11-10  DATE OF ADMISSION:  10/27/2011 DATE OF DISCHARGE:  11/02/2011  PRIMARY CARE PHYSICIAN: Dion Body, M.D.  CONSULTANTS: Efraim Kaufmann, M.D., Palliative Care  DISCHARGE DIAGNOSES:  1. Acute respiratory failure.  2. Congestive heart failure exacerbation.  3. Chronic obstructive pulmonary disease exacerbation.  4. Malignant hypertension.  5. Supratherapeutic international normalized ratio.  6. Chronic anticoagulation.  7. Chronic atrial fibrillation.  8. Anemia of chronic disease.  9. Type 2 diabetes.  10. Hypertension.   DISCHARGE MEDICATIONS:  1. Lorazepam 0.5 to 1 mg p.o./sublingual q. 2 to 4 hours p.r.n. agitation/anxiety.  2. Roxanol 20 mg/mL 0.25 to 0.5 p.o./sublingual every 1 to 2 hours p.r.n. pain/dyspnea.  3. Oxygen p.r.n.  4. Ranitidine 150 mg p.o. b.i.d.  5. Ativan 0.5 mg/Benadryl 12.5 mg/Haldol 0.5 mg/Reglan 10 mg (ABHR suppository) one PR every 4 to 6 hours p.r.n.  6. Advair 250/50, one puff b.i.d.  7. Spiriva 18 mcg inhaler once cap daily.  8. Hydralazine 25 mg every eight hours.  9. Latanoprost 0.005% ophthalmic solution one GTT OU daily.  10. Lisinopril 40 mg daily.  11. Metoprolol 100 mg b.i.d.  12. Potassium chloride 10 mEq once daily.  13. Nitroglycerin 0.4 mg daily.  14. MiraLAX 17 grams daily p.r.n.  15. Lasix 40 mg daily.  16. Benzonatate 200 mg p.o. t.i.d. p.r.n. cough.  17. Prednisone taper: 40 mg p.o. daily x2 days, 30 mg p.o. daily x2 days, 20 mg p.o. daily x2 days, then 10 mg p.o. daily x2 days, then stop.   HISTORY/HOSPITAL COURSE: This is a 81 year old female who was brought in from a nursing facility with acute respiratory distress. She has a history of congestive heart failure, chronic obstructive pulmonary disease, hypertension and chronic atrial fibrillation. On presentation, she was in acute respiratory distress and had severe hypertension with a systolic blood pressure  of over 200. She was found to have acute pulmonary edema attributed to congestive heart failure exacerbation as well as a chronic obstructive pulmonary disease exacerbation. She initially had rapid atrial fibrillation. She was admitted and treated initially with BiPAP. She was given IV diuretics and her rapid heart rate was controlled. Blood pressure medications were adjusted. Her INR was supratherapeutic. For concerns of chronic obstructive pulmonary disease exacerbation, she was also given antibiotics and a course of steroids with prednisone. She clinically improved over her hospital course. We dscussed with patient and her son discharge planning and she did not wish to retrun to a rehab facility yet she was not safe to return home alone. Palliative care was consulted as this was her third hospitalization in the last two months with similar symptoms. The palliative care team met with the patient's family and the patient who was alert and very capable of making decisions. Patient and her family elected not to have her return to her nursing facility and rather to go to a hospice home. She was discharged to a hospice home on 11/02/2011 where she will receive supportive care and comfort care measures.    ____________________________ A. Lavone Orn, MD ams:ap D: 11/02/2011 08:43:20 ET T: 11/02/2011 14:53:29 ET JOB#: [id number]  cc: A. Lavone Orn, MD, <Dictator>  Dion Body, MD Sherlon Handing MD ELECTRONICALLY SIGNED 11/03/2011 9:48

## 2014-12-11 NOTE — H&P (Signed)
PATIENT NAME:  Elaine Rowland, [name] MR#:  [id number] DATE OF BIRTH:  [birth year]  DATE OF ADMISSION:  09/18/2011  REFERRING PHYSICIAN:  Dr. Glenetta HewMcLaurin   PRIMARY CARE PHYSICIAN:  Dr. Burnadette PopLinthavong  CHIEF COMPLAINT: Fatigue, fever, shortness of breath.   HISTORY OF PRESENT ILLNESS: The patient is an 81 year old Caucasian female with history of atrial fibrillation on Coumadin, congestive heart failure, hypertension, and diabetes who presents with the above symptoms. The patient states she underwent right eye surgery for glaucoma on the 28th. The next day the patient felt unusually tired. She went to church yesterday. She felt weak with increased shortness of breath and dyspnea on exertion from her baseline. She had a fever today, noted to be about 103 per EMS. She denies having any sore throat or runny nose. Her cough is nonproductive and appears to be more chronic in nature and there are no acute changes in the cough. She has had some decreased p.o. intake as well. On arrival, she was found to be hypoxic with oxygen sats of 86% on room air. Initial blood pressure was also elevated significantly at 204/138. She was furthermore tachycardic with pulse of 109. The hospitalist services were contacted for further evaluation and management after the patient received Levaquin as initial x-ray of the chest showed possible right-sided pneumonia.   PAST MEDICAL HISTORY:  1. Congestive heart failure, unclear if systolic or diastolic.  2. Neuropathy.  3. Hypertension.  4. Anemia atrial fibrillation on Coumadin.  5. Degenerative joint disease.  6. Diabetes.  7. Glaucoma.   PAST SURGICAL HISTORY:  1. Hysterectomy.  2. Cholecystectomy.   SOCIAL HISTORY: She is widowed. She has three children. Denies smoking, tobacco, alcohol, or drug use.   FAMILY HISTORY: Dad with congestive heart failure. Mom with myocardial infarction. Two sisters with breast cancer.   MEDICATIONS:  1. Calcium with vitamin D 600/400 international  unit 2 times a day.  2. Digoxin 125 mcg daily.  3. Lasix 40 mg b.i.d.  4. Latanoprost 0.005% both eyes nightly, one drop.  5. Lisinopril 10 mg 2 tabs once a day.  6. Metformin 500 mg 2 times a day.  7. Metoprolol succinate extended release 100 mg 2 times a day.  8. Vitamin E 400 international units daily.  9. Warfarin 4 mg daily except 5 mg on Tuesdays and Thursdays.  ALLERGIES: Penicillin.   REVIEW OF SYSTEMS:  CONSTITUTIONAL:  Fever, fatigue, and weakness as above. No weight changes. EYES: Has glaucoma. ENT: No tinnitus. Has some postnasal drip. RESPIRATORY: Chronic nonproductive cough. No wheezing. Has some chronic dyspnea on exertion. No chronic obstructive pulmonary disease. CARDIOVASCULAR: No chest pain. No significant orthopnea. Has a history of atrial fibrillation. Chronic dyspnea on exertion with some worsening elevated blood pressure. No palpitations. GI: Denies nausea, vomiting, abdominal pain, or diarrhea. No melena. Has hemorrhoids. GU: Denies dysuria, hematuria, or frequency.  ENDOCRINE: Denies polyuria, nocturia, or thyroid problems. HEME/LYMPH: No anemia or easy bruising. SKIN: Denies any new rashes. MUSCULOSKELETAL: Chronic arthritis and degenerative joint disease. NEUROLOGIC: Has neuropathy. PSYCH: No anxiety or insomnia.   PHYSICAL EXAMINATION:  VITAL SIGNS: On arrival, temperature 100.9, pulse 109, currently 74, respiratory rate 23 on arrival. Blood pressure 204/138 on arrival with oxygen saturation 86% on room air on arrival. Current blood pressure was 160/62, oxygen saturation 100% on oxygen.   GENERAL: The patient is an elderly Caucasian female laying in bed in no obvious distress, talking in full sentences,   HEENT: Normocephalic, atraumatic. Pupils are equal and reactive. Anicteric  sclerae. Moist mucous membranes.   NECK: Supple. No jugular venous distention.    CARDIOVASCULAR: S1, S2. Systolic murmur closer to the axillary area and some in the  aortic region. No rubs  or gallops.   LUNGS: Good air entry. Decreased breath sounds at the bases. No crackles or significant rales appreciated.   ABDOMEN: Soft, nontender, nondistended.   EXTREMITIES: There are chronic varicosities, chronic skin changes. Mild tenderness bilateral lower extremities. No significant lower extremity edema.   SKIN:  On the right sacral/buttock area there is an area of erythema without edema or tenderness on the right medial buttock area, not on the left.  No significant warmth compared to surrounding skin.  There is also some chronic-appearing erythema bilateral lower extremities.  NEURO: Cranial nerves II through XII are grossly intact. Strength is five out of five in all extremities.   PSYCH: Awake, alert, oriented times three.   LABORATORY, DIAGNOSTIC, AND RADIOLOGICAL DATA:  EKG showing atrial fibrillation.  There are Q waves in V1 to V3, some ST downs in 2, aVF, and V6. X-ray of the chest, single view showing minimum right basilar opacity, may represent atelectasis, infection or aspiration not excluded. Mild interstitial pulmonary prominence similar to prior chest. One view, AP or PA, showing small bilateral pleural effusion, likely left greater than the right, prominent pulmonary interstitium, also cardiomegaly.     ASSESSMENT AND PLAN: We have an 81 year old Caucasian female with multiple comorbidities including atrial fibrillation and congestive heart failure who presented with weakness, increased shortness of breath, and dyspnea on exertion with fever without significant leukocytosis and also hypoxia. The patient appears to have acute hypoxic respiratory failure. Likely cause of this is possibly pneumonia given the fever and increased shortness of breath and dyspnea on exertion. I would continue the Levaquin. Blood cultures and urine cultures will be sent as well as rapid flu, which has been sent and it came back negative. The patient also has chronic congestive heart failure with some  minor pleural effusions; however, the patient does not appear to be significantly volume overloaded, and nonetheless that will not give you a fever. I would continue the outpatient Lasix dose for now.  Possible sources of fever might be sacral rash. The sacral rash is nontender and at this point I do not think it is cellulitis, given that there is no significant leukocytosis or tenderness. However, I would mark this for future reference and we will give a dose of vancomycin for now.   In regards to the atrial fibrillation, the patient came in with slightly elevated heart rate, although she had not received her regular afternoon dose of metoprolol. Currently she is rate controlled. I would continue the outpatient medications of metoprolol including the Coumadin. There is no INR drawn and I will order it for now as well as tomorrow. She did have elevated blood pressure on arrival, but now it is better controlled. I would continue the lisinopril and beta blocker.   For her diabetes I will check a hemoglobin A1c and hold metformin and start sliding scale insulin.   There are some EKG changes. However, the patient denies any chest pains currently. I would check a troponin. The patient is a FULL CODE.   TOTAL TIME SPENT: 60 minutes.    ____________________________ Krystal Eaton, MD sa:bjt D: 09/18/2011 21:45:29 ET T: 09/19/2011 06:37:56 ET JOB#: [id number]  cc: Krystal Eaton, MD, <Dictator> Marisue Ivan, MD Krystal Eaton MD ELECTRONICALLY SIGNED 10/11/2011 15:26

## 2014-12-11 NOTE — Discharge Summary (Signed)
PATIENT NAME:  Elaine Rowland, [name] MR#:  [id number] DATE OF BIRTH:  03-26-1921  DATE OF ADMISSION:  09/18/2011 DATE OF DISCHARGE:  09/27/2011  DISCHARGE DIAGNOSES:  1. Bilateral pneumonia.  2. Atrial fibrillation.  3. Hypertension.  4. Adult-onset diabetes.  5. Diastolic congestive heart failure with ejection fraction of 65%.   DISCHARGE MEDICATIONS:  1. Metoprolol tartrate 100 mg p.o. b.i.d.  2. Furosemide 20 mg p.o. b.i.d.  3. Potassium chloride 10 mEq p.o. daily.  4. Coumadin 3 mg p.o. daily to restart on Saturday, 09/28/2011. Hold Coumadin today.  5. Lisinopril 40 mg p.o. daily.  6. Hydralazine 25 mg p.o. t.i.d.  7. Xalatan 0.005% apply to each eye at bedtime.   CONSULT: Cardiology.   PROCEDURES: None.   LABORATORY, DIAGNOSTIC AND RADIOLOGICAL DATA: Pertinent labs on day of discharge: INR 3.8, white blood cell count 7.6, hemoglobin 11.7, platelets 316, sodium 142, potassium 4.6, creatinine 0.69.   BRIEF HOSPITAL COURSE:  1. Acute respiratory failure. Patient initially came in with complaints of acutely worsening respiratory failure, found to have bilateral pneumonia on x-ray, also with interstitial edema consistent with diastolic heart failure. She was initially treated with Levaquin per IV, was not responding quickly, was transitioned over to ceftriaxone and azithromycin. Did receive 10 full days of antibiotics and was discontinued. She remained afebrile. White blood cell count remained stable. She was also diuresed with Lasix and her breathing gradually improved. She will need continued O2 at this time but may be able to wean down at the facility.  2. Atrial fibrillation, remained stable. She was rate controlled. Did initially need a diltiazem drip but was quickly taken off of it while in the Intensive Care Unit. She was previously on digoxin but now she is on metoprolol it is being rate controlled with metoprolol twice a day. Will hold off on the digoxin at this time.  3. Hypertension.  She has a history of accelerated hypertension, continues to fluctuate at this time. She is doing better with the hydralazine, the lisinopril and the metoprolol together along with the furosemide; will continue this regimen at this time.  4. Other chronic medical issues remained stable. No changes.   DISPOSITION: She is in stable condition but will need further rehab. She is weak and required PT and OT therefore, [facility].   FOLLOW UP: Follow up with Dr. Burnadette PopLinthavong in 10 to 14 days. Will need a Coumadin check on Sunday and please have that called to [facility] on Monday morning.  ____________________________ Marisue IvanKanhka Anabella Capshaw, MD kl:cms D: 09/27/2011 07:54:46 ET T: 09/27/2011 08:28:49 ET JOB#: [id number]  cc: Marisue IvanKanhka Alexea Blase, MD, <Dictator> Marisue IvanKANHKA Elexus Barman MD ELECTRONICALLY SIGNED 10/03/2011 7:31

## 2014-12-11 NOTE — H&P (Signed)
PATIENT NAME:  Elaine Rowland, [name] MR#:  [id number] DATE OF BIRTH:  November 03, 1920  DATE OF ADMISSION:  10/02/2011  REFERRING PHYSICIAN: Dr. Darnelle CatalanMalinda PRIMARY CARE PHYSICIAN: Dr. Burnadette PopLinthavong  REASON FOR ADMISSION: Shortness of breath, elevated troponin, congestive heart failure.   HISTORY OF PRESENT ILLNESS: This is a pleasant 81 year old white female past medical history of hypertension, diabetes, diastolic congestive heart failure who presents to the ER with shortness of breath, was found to have elevated troponin. Chest x-ray consistent with congestive heart failure. Patient of note was discharged 09/27/2011 with bilateral pneumonia. She had been at [facility] and doing well. This morning she woke up with sudden shortness of breath. No chest pain. No nausea, vomiting, diarrhea, fevers, chills, palpitations or edema. She said she has been doing rehab. She was given nitroglycerin, Lasix in the ER. We are asked to admit the patient for diastolic congestive heart failure.   PAST MEDICAL HISTORY:  1. Congestive heart failure diastolic in nature, ejection fraction 65%. 2. Neuropathy. 3. Hypertension. 4. Atrial fibrillation. 5. Anemia. 6. Degenerative joint disease. 7. Diabetes. 8. Glaucoma.   PAST SURGICAL HISTORY:  1. Hysterectomy.  2. Cholecystectomy.   SOCIAL HISTORY: Widowed. Has three children. Denies any smoking tobacco, alcohol or drug use. She used to be able to do her activities of daily living prior to the admission on 01/30.    FAMILY HISTORY: Father died at age 10081 of heart failure. Mother died of myocardial infarction at age 79.   DRUG ALLERGIES: Penicillin.   MEDICATIONS AT [facility]:  1. Advair Diskus 250/50, 1 puff b.i.d.  2. Lasix 20 mg 2 times a day.  3. Hydralazine 25 mg q.8 hours. 4. Latanoprost 0.05% ophthalmic solution one drop in each eye at bedtime.  5. Lisinopril 40 mg daily.  6. Metoprolol 100 mg b.i.d.  7. Oxygen 3 liters.  8. Potassium chloride 10 mEq. 9. Spiriva  18 mcg inhaled daily.   REVIEW OF SYSTEMS: CONSTITUTIONAL: No fever but she has fatigue and weakness. EYES: No blurred vision, double vision, pain, redness, inflammation, glaucoma. ENT: No tinnitus, ear pain, hearing loss, seasonal allergies, epistaxis, discharge. RESPIRATORY: She has cough but no production. No wheezing, hemoptysis. She has dyspnea but no asthma. CARDIOVASCULAR: No chest pain, orthopnea, edema, arrhythmias, dyspnea on exertion, palpitations. GASTROINTESTINAL: No nausea, vomiting, diarrhea, bright red blood per rectum. GENITOURINARY: No dysuria, hematuria, renal calculi, increased frequency, incontinence. GYN/BREAST: No breast mass, tenderness, vaginal discharge. She is postmenopausal. ENDOCRINE: No polyuria, nocturia, thyroid problems, increased sweating, heat or cold intolerance. HEME/LYMPH: No anemia, easy bruising, swollen glands. INTEGUMENT: No acne, rash, change in mole, hair, or skin. MUSCULOSKELETAL: No pain in back, shoulder, knee, hip, arthritis, swelling, gout. NEUROLOGIC: No numbness, weakness, dysarthria, epilepsy, tremor, vertigo, ataxia. PSYCH: No anxiety, insomnia, ADD, bipolar, depression.  PHYSICAL EXAMINATION:  VITAL SIGNS: Temperature afebrile, heart rate 73, respiratory rate 20, blood pressure 154/56, sating 97% on 3 liters.   GENERAL: Patient is well developed, well nourished, cachectic.   HEENT: Pupils equal, reactive to light and accommodation. Extraocular movements are intact. Anicteric sclerae. No difficulty hearing. Oropharynx clear.   NECK: No JVD. No thyromegaly, no lymphadenopathy.   LUNGS: Clear to auscultation with some scant crackles at the bases. No use of accessory muscles or increased effort.   CARDIOVASCULAR: Irregularly irregular rhythm with regular rate. No murmurs, gallops, rubs appreciated. No lower extremity edema. 2+ pulses. PMI not lateralized.   BREASTS: No obvious masses.   ABDOMEN: Soft, nontender, nondistended. Positive bowel sounds.  GENITOURINARY: Deferred.   MUSCULOSKELETAL: Strength 5/5. No clubbing, cyanosis, degenerative joint disease.    SKIN: No rashes, lesions, induration.   LYMPH: No lymphadenopathy in cervical or supraclavicular area.   NEUROLOGICAL: Cranial nerves II through XII are intact. Sensation is intact. Follows commands. No focal deficit. No dysarthria, aphasia, dysphagia.   PSYCH: Alert and oriented x3. Good judgment but seems mildly depressed.   LABORATORY, DIAGNOSTIC, AND RADIOLOGICAL DATA: Chest x-ray shows pulmonary vascular congestion. Glucose 136, BNP 2633, BUN 26, creatinine 0.86, sodium 141, potassium 4.5, chloride 101, bicarbonate 33, anion gap 7, total protein 7.6, albumin 2.2, total bilirubin 0.7, alkaline phosphatase 110, AST 40, ALT 35, troponin 0.08. TSH 1.33, digoxin 0.36, white count 7.1, hemoglobin 11.3, hematocrit 34.3, platelets 329. Urinalysis shows 2+ leukocyte esterase, bacteria none, WBCs 1.   ASSESSMENT AND PLAN: This is a pleasant 81 year old white female with past medical history of diabetes, hypertension, atrial fibrillation, diastolic congestive heart failure who presents to the ER after being discharged on 02/08 from [facility] nursing home with sudden shortness of breath.  1. Acute hypoxic respiratory failure. Recently presented with bilateral pneumonia. This appears to be stable on chest x-ray and does not seem to be worsening. Patient does have history of diastolic congestive heart failure. Her BNP is elevated and she has some crackles. Do not see any worsening or new infiltrate or leukocytosis or fevers so I believe this is most likely diastolic in nature congestive heart failure exacerbation. Will consult cardiology. Will also continue ACE inhibitor and metoprolol.  2. Elevated troponin demand ischemia from malignant hypertension. Patient is on Coumadin already. Will write for p.r.n. morphine and nitroglycerin in case she truly has chest pain.  3. Atrial fibrillation, rate  controlled. Continue beta blocker in the form of metoprolol and Coumadin. INR is 2.2. 4. Malignant hypertension. Continue beta blocker, ACE inhibitor and hydralazine.  5. Diabetes. A1c 6.9. Placed on sliding scale insulin, ADA. She is no longer on metformin. We may need to reconsider this.  6. Anemia of chronic disease. Will monitor.  7. Deep vein thrombosis prophylaxis maintain with aspirin and Coumadin.  8. Elevated LFTs. Will repeat. This could be manifestation of her heart failure.  9. Low albumin. Screen for malnutrition.  10. CODE STATUS: DO NOT RESUSCITATE.   Thank you for allowing me to participate in the care of this patient. Will sign out the patient later today to Dr. Burnadette Pop.  TOTAL TIME SPENT ON ADMISSION: 55 minutes.   ____________________________ Corie Chiquito Lafayette Dragon, MD aaf:cms D: 10/02/2011 13:53:22 ET T: 10/02/2011 14:16:07 ET JOB#: [id number]  cc: Karolee Ohs A. Lafayette Dragon, MD, <Dictator> Marisue Ivan, MD  Coralyn Pear MD ELECTRONICALLY SIGNED 10/02/2011 16:11

## 2014-12-11 NOTE — Discharge Summary (Signed)
PATIENT NAME:  Elaine Rowland, Elaine Rowland MR#:  [id number] DATE OF BIRTH:  Nov 20, 1920  DATE OF ADMISSION:  10/02/2011 DATE OF DISCHARGE:  10/04/2011  DISCHARGE DIAGNOSES:  1. Congestive heart failure, diastolic in nature, ejection fraction 65%.  2. Diabetes mellitus, non-insulin-requiring.  3. Hypertension.  4. Chronic atrial fibrillation.  5. Chronic anemia.  6. Glaucoma.  7. Osteoarthritis.  8. Chronic obstructive pulmonary disease.   DISCHARGE MEDICATIONS:  1. Aspirin 81 mg daily. 2. Advair 250/50 twice a day.  3. Hydralazine 25 mg three times daily. 4. Lisinopril 40 mg daily.  5. Metoprolol tartrate 100 mg twice a day. 6. Nitroglycerin patch 0.4 mg topical daily.  7. Spiriva 1 daily.  8. Coumadin 3 mg at bedtime.  9. Torsemide 20 mg 1-1/2 daily.  10. Potassium chloride 10 mEq daily.  11. MiraLax 17 grams daily p.r.n.  12. Latanoprost 0.05% one drop to both eyes at bedtime.  13. Tylenol 650 mg every 4 hours p.r.n.   REASON FOR ADMISSION: This is an 81 year old female who presents with dyspnea. Please see the History and Physical for history of present illness, past medical history, and physical exam.   HOSPITAL COURSE: The patient was admitted, diuresed with IV Lasix. Oxygenation improved back to baseline. No sign of pneumonia. Blood pressure is reasonably controlled. Sugars were less than 150. She will be discharged on her usual medicines with higher dose of torsemide and very strict low salt diet. Keeping her systolic pressure less than 150 is important.  ____________________________ Danella PentonMark F. Morena Mckissack, MD mfm:slb D: 10/04/2011 13:22:05 ET T: 10/04/2011 13:29:20 ET JOB#: [id number]  cc: Danella PentonMark F. Waylan Busta, MD, <Dictator> Marisue IvanKanhka Linthavong, MD Danella PentonMARK F Etola Mull MD ELECTRONICALLY SIGNED 10/05/2011 10:30

## 2014-12-11 NOTE — H&P (Signed)
PATIENT NAME:  Elaine Rowland, Elaine Rowland MR#:  [id number] DATE OF BIRTH:  [birth year]  DATE OF ADMISSION:  10/27/2011  PRIMARY CARE PHYSICIAN: Dr. Burnadette Pop    CHIEF COMPLAINT: Acute respiratory distress.   HISTORY OF PRESENT ILLNESS: Elaine Rowland is an 81 year old pleasant Caucasian female who was transferred from the nursing home by EMS to the hospital for evaluation of acute respiratory distress. The nursing home noted that she was severely short of breath and she also had cyanosis around her lips. This improved after oxygen supplementation. She was placed on oxygen nonrebreather. The patient admits having shortness of breath and some nausea, but denies having any chest pain. No sputum production. No fever. No chills.   REVIEW OF SYSTEMS: CONSTITUTIONAL: Denies any fever. No chills. No night sweats. No fatigue. EYES: No blurring of vision. No double vision. ENT: No hearing impairment. No sore throat. No dysphagia, but reports dryness of mouth. CARDIOVASCULAR: No chest pain. Admits having shortness of breath. No edema. No syncope. RESPIRATORY: No cough. No sputum production. No chest pain but admits having shortness of breath. GASTROINTESTINAL: Had nausea but no vomiting, no abdominal pain. No diarrhea. GENITOURINARY: No dysuria. No frequency of urination. MUSCULOSKELETAL: No joint swelling or pain. No muscular pain or swelling. INTEGUMENT: No skin rash. No ulcers. NEUROLOGY: No focal weakness. No seizure activity. No headache. PSYCH: No anxiety, no depression. ENDOCRINE: No heat or cold intolerance. No night sweats.   PAST MEDICAL HISTORY:  1. History of congestive cardiomyopathy with normal ejection fraction at 65%. Her congestive heart failure is based on diastolic congestive heart failure.  2. Systemic hypertension.  3. Chronic atrial fibrillation. 4. Anemia of chronic disease.  5. Diabetes mellitus, type 2, on no treatment.  6. History of glaucoma.  7. History of chronic obstructive pulmonary disease.   8. History of osteoarthritis.  9.   Chronic anticoagulation with Coumadin.  PAST SURGICAL HISTORY:  1. History of cholecystectomy.  2. History of hysterectomy.   SOCIAL HISTORY: She is widowed, has three children. She resides now at a nursing home.   SOCIAL HABITS: Nonsmoker. No history of alcohol or drug abuse.   ADMISSION MEDICATIONS:  1. Aspirin 81 mg a day.  2. Hydralazine 25 mg 3 times a day.  3. Lisinopril 40 mg once a day.  4. Metoprolol tartrate 100 mg twice a day.  5. Nitroglycerin patch 0.4 mg topical once a day.  6. Coumadin 3 mg once a day.  7. Torsemide 20 mg, 1-1/2 tablets a day, 30 mg a day.  8. Advair 250/50 twice a day. 9. Spiriva 1 inhalation once a day.  10. Potassium chloride 10 mEq once a day.  11. MiraLAX 17 grams once a day.  12. Tylenol p.r.n. 13. Latanoprost 0.005%, one drop in both eyes once a day at night.   ALLERGIES: Penicillin.   PHYSICAL EXAMINATION:  VITAL SIGNS: Initial blood pressure was 207/102.  Follow-up blood pressure went down later to 131/67. Respiratory rate 26, pulse 110, earlier was 146. It is regular. Temperature 96.7, oxygen saturation is 94% while she is on 100% oxygen nonrebreather. Removing the face mask she becomes cyanotic. Right now she is on BiPAP treatment.   GENERAL APPEARANCE: Elderly thin-looking lady in mild respiratory distress right now, showing some improvement after BiPAP treatment.   HEAD/NECK: No pallor. No icterus. No cyanosis.   ENT: Hearing was normal. Nasal mucosa, lips, tongue were normal.   EYES: Normal iris and conjunctivae. Pupils about 3 to 4 mm, sluggishly reactive to light.  NECK: Supple. Trachea at midline. No thyromegaly. No lymphadenopathy. No masses.   HEART: Regular S1, S2. There is a grade 3/6 systolic murmur at the apex and left sternal border. No carotid bruits.   LUNGS: Slight tachypnea. Fine rales in one-third on both lung fields. The patient is using accessory muscles in her breathing.    ABDOMEN: Soft without tenderness. No hepatosplenomegaly. No masses. No hernias.   SKIN: No ulcers. No subcutaneous nodules.   MUSCULOSKELETAL: No joint swelling. No clubbing.  NEURO:  Cranial nerves II-XII are intact. No focal motor deficit.   PSYCHIATRIC: The patient is alert, oriented to place and people. Mood and affect were normal.   LABORATORY, DIAGNOSTIC, AND RADIOLOGICAL DATA: Chest x-ray revealed cardiomegaly and evidence of pulmonary interstitial edema and pulmonary vascular congestion as well. EKG showed atrial fibrillation with rapid ventricular rate at 146 per minute, nonspecific ST-T wave abnormalities. One ventricular premature complex or aberrant conduction. Serum glucose was 164. B-type natriuretic peptide was 4044. BUN 26, creatinine 0.9, sodium 139, potassium 4.4. Liver function tests were normal except for slightly low albumin at 3.3, AST 40. Troponin was marginally elevated at 0.06. CBC showed white count 6800, hemoglobin 9.5, hematocrit 29, platelet count 153. Prothrombin time 21, INR 1.8. Arterial blood gas while on BiPAP and oxygen supplementation: pH 7.39, pCO2 52, pO2 192. This is on FiO2 of 60%.   IMPRESSION:  1. Acute pulmonary edema with past medical history of congestive heart failure with normal ejection fraction at 65%. The patient has a history of diastolic congestive cardiomyopathy.  2. Acute respiratory failure requiring BiPAP treatment.  3. Atrial fibrillation with rapid ventricular rate. The patient does have history of chronic atrial fibrillation.  4. Malignant hypertension. Her blood pressure was more than 200 systolic, associated with congestive cardiomyopathy decompensation.  5. Chronic anticoagulation with Coumadin.  6. Chronic obstructive pulmonary disease.  7. Chronic anemia.  8. History of peripheral neuropathy.  9. History of diabetes mellitus type 2, on no treatment.  10. History of arthritis.  11. History of glaucoma.  12. Do not resuscitate  status.  PLAN:  1. The patient will be admitted. Follow up cardiac enzymes. Continue BiPAP treatment until resolution of her pulmonary vascular congestion and edema. Intravenous Lasix 40 mg q.12 hours until normalization of the intravascular volume and resolution of her pulmonary edema. Then we will continue on her treatment with torsemide or Demadex.  2. Regarding her anticoagulation, her prothrombin time and INR are slightly subtherapeutic. She will receive one dose of Coumadin 5 mg now and then tomorrow will resume her usual dose of 3 mg daily. Continue her home medications as listed above.  3. CODE STATUS is DO NOT RESUSCITATE as documented by her primary care physician, Dr. Burnadette PopLinthavong.  4. For deep vein thrombosis prophylaxis, the patient is already on anticoagulation with Coumadin.   TIME NEEDED TO EVALUATE THIS PATIENT: More than 55 minutes.     ____________________________ Carney CornersAmir M. Rudene Rearwish, MD amd:bjt D: 10/27/2011 05:54:11 ET T: 10/27/2011 08:40:22 ET JOB#: [id number]  cc: Carney CornersAmir M. Rudene Rearwish, MD, <Dictator> Marisue IvanKanhka Linthavong, MD Karolee OhsAMIR Dala DockM Khaleah Duer MD ELECTRONICALLY SIGNED 10/27/2011 22:15

## 2014-12-11 NOTE — Consult Note (Signed)
Present Illness 81 year old female with known chronic atrial fibrillation with controlled ventricular rate.  Previously on metoprolol with mitral valve disease and left jugular hypertrophy with hypertension having an acute respiratory illness with infection.  The patient had significant shortness of breath and some chest pressure and pain.  This chest pressure and pain was more associated with her current illness rather than acute angina.  The patient does have some elevated troponin, more consistent with her current illness and didn't and ischemia rather than acute myocardial infarction.  The patient had malignant hypertension with the treatment of her current illness with atrial fibrillation with rapid ventricular rate.  She was placed on an intravenous diltiazem drip and has excellent heart rate control at this time.  Current EKG shows atrial fibrillation with controlled ventricular rate and nonspecific ST changes.  Currently she has some relief of her symptoms.  The patient does have diabetes mellitus, hyperlipidemia, which are stable at this time  Family history No family history of cardiovascular disease  Social history Patient currently denies alcohol or tobacco use   Physical Exam:   GEN WD    HEENT pink conjunctivae    NECK No masses    RESP postive use of accessory muscles  wheezing  crackles    CARD Irregular rate and rhythm  Murmur    Murmur Systolic    Systolic Murmur axilla    ABD denies tenderness  soft    LYMPH negative neck    EXTR negative cyanosis/clubbing    SKIN No rashes    NEURO cranial nerves intact    PSYCH alert   Review of Systems:   Subjective/Chief Complaint I'm short of breath and had chest pain    Respiratory: Short of breath    Cardiovascular: Chest pain or discomfort    Review of Systems: All other systems were reviewed and found to be negative    Medications/Allergies Reviewed Medications/Allergies reviewed        Admit  Diagnosis:   ACUTE RESP FAILURE: 19-Sep-2011, Active, ACUTE RESP FAILURE      Admit Reason:   Pneumonia: (486) 18-Sep-2011, Active, ICD9, PNEUMONIA, ORGANISM NOS   Acute respiratory failure: (518.81) 18-Sep-2011, Active, ICD9, Acute respiratory failure  Home Medications:  Calcarb with D 600 mg-400 intl units oral tablet: 1 tab(s) orally 2 times a day, Active  warfarin 5 mg oral tablet: 1 tab(s) orally on tuesday and thursday., Active  warfarin 4 mg oral tablet: 1 tab(s) orally Monday, Wednesday, Friday, Saturday, and Sunday., Active  digoxin 125 mcg (0.125 mg) oral tablet: 1 tab(s) orally once a day, Active  furosemide 40 mg oral tablet: 1 tab(s) orally 2 times a day, Active  lisinopril 10 mg oral tablet: 2 tab(s) orally once a day, Active  metformin 500 mg oral tablet: 1 tab(s) orally 2 times a day with meals (take with dinner), Active  Metoprolol Succinate ER 100 mg oral tablet, extended release: 1 tab(s) orally 2 times a day. **brand name toprol-xl**, Active  vitamin E 400 intl units oral capsule: 1 cap(s) orally once a day, Active  latanoprost 0.005% ophthalmic solution: 1 drop(s) in both eyes once a day (at bedtime). **brand name xalatan**, Active  freestyle lite strips: test blood sugar as directed.  , Active  Routine Hem:  30-Jan-13 17:57    WBC (CBC) 3.9   RBC (CBC) 3.98   Hemoglobin (CBC) 12.4   Hematocrit (CBC) 37.4   Platelet Count (CBC) 108   MCV 94   MCH 31.1  MCHC 33.1   RDW 14.1   Neutrophil % 71.0   Lymphocyte % 14.6   Monocyte % 12.4   Eosinophil % 1.5   Basophil % 0.5   Neutrophil # 2.8   Lymphocyte # 0.6   Monocyte # 0.5   Eosinophil # 0.1   Basophil # 0.0  Routine Chem:  30-Jan-13 17:57    Glucose, Serum 137   BUN 19   Creatinine (comp) 0.86   Sodium, Serum 137   Potassium, Serum 3.7   Chloride, Serum 97   CO2, Serum 30   Calcium (Total), Serum 8.9  Hepatic:  30-Jan-13 17:57    Bilirubin, Total 0.7   Alkaline Phosphatase 78   SGPT (ALT) 27    SGOT (AST) 29   Total Protein, Serum 7.5   Albumin, Serum 3.8  Routine Chem:  30-Jan-13 17:57    Osmolality (calc) 278   eGFR (African American) >60   eGFR (Non-African American) >60   Anion Gap 10  TDMs:  30-Jan-13 17:57    Digoxin, Serum 2.33  Routine Coag:  30-Jan-13 17:57    Prothrombin 19.8   INR 1.6  Cardiac:  30-Jan-13 17:57    Troponin I 0.10  Cardiology:  30-Jan-13 18:07    Ventricular Rate 100   Atrial Rate 93   QRS Duration 82   QT 330   QTc 425   R Axis 16   T Axis -54  Routine UA:  30-Jan-13 19:40    Color (UA) Yellow   Clarity (UA) Clear   Glucose (UA) Negative   Bilirubin (UA) Negative   Ketones (UA) Negative   Specific Gravity (UA) 1.006   Blood (UA) Negative   pH (UA) 6.0   Protein (UA) Negative   Nitrite (UA) Negative   Leukocyte Esterase (UA) 1+   RBC (UA) 3 /HPF   WBC (UA) 3 /HPF   Epithelial Cells (UA) <1 /HPF   Mucous (UA) PRESENT  Routine Micro:  30-Jan-13 19:40    Specimen Source CC  Routine Hem:  31-Jan-13 02:04    WBC (CBC) 3.1   RBC (CBC) 3.67   Hemoglobin (CBC) 11.6   Hematocrit (CBC) 34.6   Platelet Count (CBC) 93   MCV 94   MCH 31.5   MCHC 33.4   RDW 13.8   Neutrophil % 69.3   Lymphocyte % 14.8   Monocyte % 13.4   Eosinophil % 1.9   Basophil % 0.6   Neutrophil # 2.1   Lymphocyte # 0.5   Monocyte # 0.4   Eosinophil # 0.1   Basophil # 0.0  Routine Chem:  31-Jan-13 02:04    Glucose, Serum 137   BUN 16   Creatinine (comp) 0.93   Sodium, Serum 141   Potassium, Serum 3.4   Chloride, Serum 101   CO2, Serum 30   Calcium (Total), Serum 7.9   Osmolality (calc) 285   eGFR (African American) >60   eGFR (Non-African American) >60   Anion Gap 10  Routine Coag:  31-Jan-13 02:04    Prothrombin 18.9   INR 1.5  Cardiac:  31-Jan-13 02:04    Troponin I 0.11  Routine Chem:  31-Jan-13 02:04    Magnesium, Serum 2.1   Hemoglobin A1c ([facility]) 6.9   Cholesterol, Serum 102   Triglycerides, Serum 127   HDL ([facility]HOU[facility]) 20    VLDL Cholesterol Calculated 25   LDL Cholesterol Calculated 57  Cardiac:  31-Jan-13 09:18    Troponin I 0.20   CPK-MB,  Serum 1.0  Lab:  31-Jan-13 10:00    pH (ABG) 7.38   PCO2 47   PO2 211   FiO2 100   Base Excess 2.4   HCO3 27.2   O2 Saturation 99.6   O2 Device mask   Specimen Type (ABG) ARTERIAL   Patient Temp (ABG) 39.6    17:00    pH (ABG) 7.39   PCO2 47   PO2 217   FiO2 100   Base Excess 2.7   HCO3 28.5   O2 Saturation 99.8   Specimen Type (ABG) ARTERIAL   Patient Temp (ABG) 37.0    Penicillin V Potassium: Unknown  penicillins: Unknown  Vital Signs/Nurse's Notes: **Vital Signs.:   31-Jan-13 15:00   Pulse Pulse 70   Respirations Respirations 32   Systolic BP Systolic BP 514   Diastolic BP (mmHg) Diastolic BP (mmHg) 55   Mean BP 80   Pulse Ox % Pulse Ox % 94   Oxygen Delivery 5L   Pulse Ox Heart Rate 27     Impression 81 year old female with known chronic atrial fibrillation, mitral valve disease, hypertension, hyperlipidemia, diabetes mellitus with chest pain and pressure more consistent with her current illness and respiratory failure rather than acute cardial infarction with atrial fibrillation with rapid ventricular rate, now more controlled on a drip    Plan 1.  Continue diltiazem drip and add oral diltiazem on top of metoprolol for better heart rate control while patient is recovering from respiratory illness. 2.  Echocardiogram for LV systolic dysfunction, valvular heart disease contributing to current symptoms and atrial fibrillation. 3.  Continue anticoagulation with a goal INR between 2-3 for further risk reduction instrument. 4.  Further serial ECG and enzymes to assess for possible myocardial infarction. 5.  Begin ambulation when able and further workup as necessary after above   Electronic Signatures: Corey Skains (MD)  (Signed 31-Jan-13 17:53)  Authored: General Aspect/Present Illness, History and Physical Exam, Review of System, Health  Issues, Home Medications, Labs, Allergies, Vital Signs/Nurse's Notes, Impression/Plan   Last Updated: 31-Jan-13 17:53 by Corey Skains (MD)
